# Patient Record
Sex: Female | Born: 1960 | Hispanic: No | Smoking: Current every day smoker
Health system: Southern US, Community
[De-identification: ages and names within clinical notes are randomized; demographics above are authoritative.]

## PROBLEM LIST (undated history)

## (undated) DIAGNOSIS — F4310 Post-traumatic stress disorder, unspecified: Secondary | ICD-10-CM

## (undated) DIAGNOSIS — M1990 Unspecified osteoarthritis, unspecified site: Secondary | ICD-10-CM

## (undated) DIAGNOSIS — F419 Anxiety disorder, unspecified: Secondary | ICD-10-CM

## (undated) DIAGNOSIS — F1990 Other psychoactive substance use, unspecified, uncomplicated: Secondary | ICD-10-CM

## (undated) DIAGNOSIS — F1020 Alcohol dependence, uncomplicated: Secondary | ICD-10-CM

## (undated) DIAGNOSIS — E785 Hyperlipidemia, unspecified: Secondary | ICD-10-CM

## (undated) DIAGNOSIS — F458 Other somatoform disorders: Secondary | ICD-10-CM

## (undated) DIAGNOSIS — K219 Gastro-esophageal reflux disease without esophagitis: Secondary | ICD-10-CM

## (undated) DIAGNOSIS — I10 Essential (primary) hypertension: Secondary | ICD-10-CM

## (undated) DIAGNOSIS — J449 Chronic obstructive pulmonary disease, unspecified: Secondary | ICD-10-CM

## (undated) DIAGNOSIS — F32A Depression, unspecified: Secondary | ICD-10-CM

## (undated) HISTORY — PX: WRIST RECONSTRUCTION: SHX2675

## (undated) HISTORY — DX: Alcohol dependence, uncomplicated: F10.20

## (undated) HISTORY — PX: TUBAL LIGATION: SHX77

## (undated) HISTORY — DX: Post-traumatic stress disorder, unspecified: F43.10

## (undated) HISTORY — DX: Gastro-esophageal reflux disease without esophagitis: K21.9

## (undated) HISTORY — DX: Anxiety disorder, unspecified: F41.9

## (undated) HISTORY — DX: Essential (primary) hypertension: I10

## (undated) HISTORY — DX: Depression, unspecified: F32.A

## (undated) HISTORY — DX: Hyperlipidemia, unspecified: E78.5

## (undated) HISTORY — DX: Unspecified osteoarthritis, unspecified site: M19.90

## (undated) HISTORY — DX: Other psychoactive substance use, unspecified, uncomplicated: F19.90

## (undated) HISTORY — DX: Chronic obstructive pulmonary disease, unspecified: J44.9

---

## 1998-10-17 ENCOUNTER — Emergency Department (HOSPITAL_COMMUNITY): Admission: EM | Admit: 1998-10-17 | Discharge: 1998-10-17 | Payer: Self-pay | Admitting: Emergency Medicine

## 1999-09-01 ENCOUNTER — Emergency Department (HOSPITAL_COMMUNITY): Admission: EM | Admit: 1999-09-01 | Discharge: 1999-09-01 | Payer: Self-pay | Admitting: Emergency Medicine

## 2000-08-27 ENCOUNTER — Emergency Department (HOSPITAL_COMMUNITY): Admission: EM | Admit: 2000-08-27 | Discharge: 2000-08-28 | Payer: Self-pay | Admitting: Emergency Medicine

## 2000-08-28 ENCOUNTER — Encounter: Payer: Self-pay | Admitting: Emergency Medicine

## 2000-09-20 ENCOUNTER — Emergency Department (HOSPITAL_COMMUNITY): Admission: EM | Admit: 2000-09-20 | Discharge: 2000-09-20 | Payer: Self-pay | Admitting: Emergency Medicine

## 2000-11-04 ENCOUNTER — Encounter: Payer: Self-pay | Admitting: Emergency Medicine

## 2000-11-04 ENCOUNTER — Emergency Department (HOSPITAL_COMMUNITY): Admission: EM | Admit: 2000-11-04 | Discharge: 2000-11-04 | Payer: Self-pay | Admitting: Emergency Medicine

## 2002-01-10 ENCOUNTER — Emergency Department (HOSPITAL_COMMUNITY): Admission: EM | Admit: 2002-01-10 | Discharge: 2002-01-10 | Payer: Self-pay | Admitting: Emergency Medicine

## 2002-06-01 ENCOUNTER — Emergency Department (HOSPITAL_COMMUNITY): Admission: EM | Admit: 2002-06-01 | Discharge: 2002-06-01 | Payer: Self-pay | Admitting: Emergency Medicine

## 2004-12-03 ENCOUNTER — Emergency Department (HOSPITAL_COMMUNITY): Admission: EM | Admit: 2004-12-03 | Discharge: 2004-12-03 | Payer: Self-pay | Admitting: Emergency Medicine

## 2006-01-30 ENCOUNTER — Emergency Department (HOSPITAL_COMMUNITY): Admission: EM | Admit: 2006-01-30 | Discharge: 2006-01-30 | Payer: Self-pay | Admitting: Family Medicine

## 2006-03-27 ENCOUNTER — Emergency Department (HOSPITAL_COMMUNITY): Admission: EM | Admit: 2006-03-27 | Discharge: 2006-03-27 | Payer: Self-pay | Admitting: Family Medicine

## 2006-09-24 ENCOUNTER — Emergency Department (HOSPITAL_COMMUNITY): Admission: EM | Admit: 2006-09-24 | Discharge: 2006-09-24 | Payer: Self-pay | Admitting: Emergency Medicine

## 2006-09-25 ENCOUNTER — Emergency Department (HOSPITAL_COMMUNITY): Admission: EM | Admit: 2006-09-25 | Discharge: 2006-09-25 | Payer: Self-pay | Admitting: Emergency Medicine

## 2006-09-27 ENCOUNTER — Ambulatory Visit (HOSPITAL_COMMUNITY): Admission: RE | Admit: 2006-09-27 | Discharge: 2006-09-28 | Payer: Self-pay | Admitting: Orthopedic Surgery

## 2006-10-23 ENCOUNTER — Encounter: Admission: RE | Admit: 2006-10-23 | Discharge: 2006-12-24 | Payer: Self-pay | Admitting: Orthopedic Surgery

## 2007-04-10 ENCOUNTER — Emergency Department (HOSPITAL_COMMUNITY): Admission: EM | Admit: 2007-04-10 | Discharge: 2007-04-10 | Payer: Self-pay | Admitting: Emergency Medicine

## 2009-02-15 ENCOUNTER — Emergency Department (HOSPITAL_COMMUNITY): Admission: EM | Admit: 2009-02-15 | Discharge: 2009-02-15 | Payer: Self-pay | Admitting: Family Medicine

## 2009-04-06 ENCOUNTER — Emergency Department (HOSPITAL_COMMUNITY): Admission: EM | Admit: 2009-04-06 | Discharge: 2009-04-06 | Payer: Self-pay | Admitting: Emergency Medicine

## 2009-04-26 ENCOUNTER — Encounter: Admission: RE | Admit: 2009-04-26 | Discharge: 2009-06-08 | Payer: Self-pay | Admitting: Orthopedic Surgery

## 2010-07-07 ENCOUNTER — Emergency Department (HOSPITAL_COMMUNITY): Admission: EM | Admit: 2010-07-07 | Discharge: 2010-07-07 | Payer: Self-pay | Admitting: Family Medicine

## 2010-09-13 ENCOUNTER — Emergency Department (HOSPITAL_COMMUNITY)
Admission: EM | Admit: 2010-09-13 | Discharge: 2010-09-13 | Payer: Self-pay | Source: Home / Self Care | Admitting: Family Medicine

## 2011-01-08 ENCOUNTER — Emergency Department (HOSPITAL_COMMUNITY)
Admission: EM | Admit: 2011-01-08 | Discharge: 2011-01-08 | Discharge status: Against Medical Advice | Payer: Self-pay | Attending: Emergency Medicine | Admitting: Emergency Medicine

## 2011-01-08 DIAGNOSIS — X58XXXA Exposure to other specified factors, initial encounter: Secondary | ICD-10-CM | POA: Insufficient documentation

## 2011-01-08 DIAGNOSIS — S61209A Unspecified open wound of unspecified finger without damage to nail, initial encounter: Secondary | ICD-10-CM | POA: Insufficient documentation

## 2011-01-25 NOTE — Op Note (Signed)
NAME:  Lori Houston, CONSUEGRA            ACCOUNT NO.:  [id number]   MEDICAL RECORD NO.:  [id number]          PATIENT TYPE:  OIB   LOCATION:  2550                         FACILITY:  [facility]   PHYSICIAN:  Dionne Ano. Gramig III, M.D.DATE OF BIRTH:  [birth year]   DATE OF PROCEDURE:  09/27/2006  DATE OF DISCHARGE:                               OPERATIVE REPORT   PREOPERATIVE DIAGNOSIS:  Right comminuted complex interarticular distal  radius fracture.   POSTOPERATIVE DIAGNOSIS:  Right comminuted complex interarticular distal  radius fracture.   PROCEDURE:  1. Right distal radius open reduction internal fixation with DVR plate      and screw fixation.  2. Stress radiography.   SURGEON:  Dominica Severin.   ASSISTANT:  Karie Chimera.   COMPLICATIONS:  None.   ANESTHESIA:  General.   TOURNIQUET TIME:  Less than an hour.   DRAINS:  One.   INDICATIONS FOR PROCEDURE:  The patient is a 50 year old female who  presents with a distal radius fracture.  She was reduced 2-1/2 days ago  and has undergone progressive angulatory collapse.  I have discussed  with her the implications of such and the need for ORIF to preserve  radial height, volar tilt and inclination.  I have discussed with the  patient the risks and benefits of surgery including risk of bleeding,  infection, anesthesia, damage to normal structures and failure of  surgery to accomplish its intended goals of relieving symptoms and  restoring function.  With this in mind, she desires to proceed.  All  questions have been encouraged and answered.   OPERATIVE PROCEDURE:  The patient was seen by myself and anesthesia.  She presented to the operative suite and underwent a smooth induction of  a general anesthesia.  Arm was marked, permit was signed, and she was  given preoperative antibiotics.  Following this, she was prepped and  draped in usual sterile fashion after general anesthesia was secured.  Once a sterile field was secured and  she was nicely padded, I inflated  the tourniquet to 250 mmHg.  I made a volar radial approach to the  wrist.  Carpal canal contents were retracted ulnarly.  Pronator  quadratus was incised in L-shaped fashion although it was highly damaged  and irregular.  The patient had radial to ulnar elevation of the  pronator to access the fracture.  Following this, the fracture was  reduced to my satisfaction without difficulty.  Once it was reduced, I  then applied a DVR plate from [employer] in the standard AO  technique.  The patient tolerated this well.  This restored her radial  height, radial tilt/volar tilt and radial inclination.  The patient  tolerated the application quite well.  Once this was done, the patient  then underwent a smooth irrigation process followed by stress  radiography revealing excellent position of the plate and screw  construct.  She had excellent stability under live fluoro.  No tendency  towards collapse or other problems.  I was pleased with the findings.  At this time, we then deflated the tourniquet, repaired the pronator  quadratus as best  as it would allow [place] with 3-0 Vicryl, and following  this, placed a PLS drain.  Vicryl was used for the subcu, and skin edge  was repaired with Prolene.  The patient had proximally 15-20 mL of 0.25%  Marcaine without epinephrine placed in the incision site for  postoperative analgesia.  She was dressed with Xeroform followed by  gauze and a volar plaster splint.  She tolerated this well.   Her distal radioulnar joint was stable throughout the course of the  operative procedure, and we were quite pleased with this.   Once this was complete, the patient then underwent transfer to the  recovery room after extubation.  In the recovery room, she was stable.  She will be admitted for IV antibiotics, general observation and  postoperative management.  We have discussed with her the do's and  dont's and will continue close  observation in the overnight.  All  questions have been encouraged and answered.  At the time of first  postoperative visit, I will need her splint removed and x-rays taken, of  course, followed by cast application.           ______________________________  Dionne Ano. Everlene Other, M.D.     Nash Mantis  D:  09/27/2006  T:  09/27/2006  Job:  [id number]

## 2011-06-24 LAB — RAPID STREP SCREEN (MED CTR MEBANE ONLY): Streptococcus, Group A Screen (Direct): NEGATIVE

## 2011-07-23 ENCOUNTER — Encounter: Payer: Self-pay | Admitting: *Deleted

## 2011-07-23 ENCOUNTER — Emergency Department (HOSPITAL_COMMUNITY)
Admission: EM | Admit: 2011-07-23 | Discharge: 2011-07-23 | Discharge status: Against Medical Advice | Payer: Self-pay | Attending: Emergency Medicine | Admitting: Emergency Medicine

## 2011-07-23 DIAGNOSIS — K089 Disorder of teeth and supporting structures, unspecified: Secondary | ICD-10-CM | POA: Insufficient documentation

## 2011-07-23 DIAGNOSIS — R109 Unspecified abdominal pain: Secondary | ICD-10-CM | POA: Insufficient documentation

## 2011-07-23 LAB — URINALYSIS, ROUTINE W REFLEX MICROSCOPIC
Bilirubin Urine: NEGATIVE
Hgb urine dipstick: NEGATIVE
Nitrite: NEGATIVE
Protein, ur: NEGATIVE mg/dL
Specific Gravity, Urine: 1.019 (ref 1.005–1.030)
Urobilinogen, UA: 1 mg/dL (ref 0.0–1.0)

## 2011-07-23 LAB — RAPID URINE DRUG SCREEN, HOSP PERFORMED
Barbiturates: NOT DETECTED
Cocaine: POSITIVE — AB
Opiates: NOT DETECTED
Tetrahydrocannabinol: POSITIVE — AB

## 2011-07-23 NOTE — ED Notes (Signed)
To ed for eval of toothache and flank pain for the past 2 days.

## 2011-07-25 ENCOUNTER — Emergency Department (HOSPITAL_COMMUNITY)
Admission: EM | Admit: 2011-07-25 | Discharge: 2011-07-26 | Disposition: A | Discharge status: Home / Self Care | Payer: Self-pay | Attending: Emergency Medicine | Admitting: Emergency Medicine

## 2011-07-25 ENCOUNTER — Encounter (HOSPITAL_COMMUNITY): Payer: Self-pay | Admitting: Emergency Medicine

## 2011-07-25 DIAGNOSIS — K089 Disorder of teeth and supporting structures, unspecified: Secondary | ICD-10-CM | POA: Insufficient documentation

## 2011-07-25 DIAGNOSIS — F17200 Nicotine dependence, unspecified, uncomplicated: Secondary | ICD-10-CM | POA: Insufficient documentation

## 2011-07-25 DIAGNOSIS — K029 Dental caries, unspecified: Secondary | ICD-10-CM | POA: Insufficient documentation

## 2011-07-25 DIAGNOSIS — K0889 Other specified disorders of teeth and supporting structures: Secondary | ICD-10-CM

## 2011-07-25 DIAGNOSIS — M5430 Sciatica, unspecified side: Secondary | ICD-10-CM | POA: Insufficient documentation

## 2011-07-25 HISTORY — DX: Other somatoform disorders: F45.8

## 2011-07-25 LAB — URINALYSIS, ROUTINE W REFLEX MICROSCOPIC
Bilirubin Urine: NEGATIVE
Glucose, UA: NEGATIVE mg/dL
Hgb urine dipstick: NEGATIVE
Ketones, ur: NEGATIVE mg/dL
Leukocytes, UA: NEGATIVE
Nitrite: NEGATIVE
Protein, ur: NEGATIVE mg/dL
Specific Gravity, Urine: 1.008 (ref 1.005–1.030)
Urobilinogen, UA: 0.2 mg/dL (ref 0.0–1.0)
pH: 7 (ref 5.0–8.0)

## 2011-07-25 NOTE — ED Provider Notes (Signed)
History     CSN: [id number] Arrival date & time: 07/25/2011 10:12 PM   First MD Initiated Contact with Patient 07/25/11 2348      Chief Complaint  Patient presents with  . Flank Pain  . Headache  . Dental Pain  . Oral Swelling    (Consider location/radiation/quality/duration/timing/severity/associated sxs/prior treatment) HPI This is a 50 year old black female with about a five-day history of bilateral lower back pain. The pain is located in the upper buttocks and radiates down the back of the legs. It is primarily located on the right side but sometimes shifts to the left. It is moderate to severe and worse with movement of the legs at the hips or when sitting. There is no associated weakness or numbness. There is no associated bowel or bladder difficulty. There was no trauma that triggered it. Thinking the pain was from her kidney she has been drinking excessive amounts of water and cranberry juice without relief.  She also complains of toothache in her right upper lateral incisor. This tooth has been chipped for a long period of time. It began hurting about 3 weeks ago and is worsening. It is causing her to have a headache. It is worse with eating  History reviewed. No pertinent past medical history.  Past Surgical History  Procedure Date  . Wrist reconstruction     History reviewed. No pertinent family history.  History  Substance Use Topics  . Smoking status: Current Everyday Smoker -- 1.0 packs/day    Types: Cigarettes  . Smokeless tobacco: Not on file  . Alcohol Use: 1.2 oz/week    2 Cans of beer per week    OB History    Grav Para Term Preterm Abortions TAB SAB Ect Mult Living   3    1           Review of Systems  All other systems reviewed and are negative.    Allergies  Review of patient's allergies indicates no known allergies.  Home Medications  No current outpatient prescriptions on file.  BP 141/93  Pulse 93  Temp(Src) 98.3 F (36.8 C) (Oral)   Resp 18  SpO2 99%  LMP 07/10/2011  Physical Exam General: Well-developed, well-nourished female in no acute distress; appearance consistent with age of record HENT: normocephalic, atraumatic; non-molar teeth worn down to dentin; carious right upper lateral incisor, tender to percussion Eyes: pupils equal round and reactive to light; extraocular muscles intact Neck: supple Heart: regular rate and rhythm Lungs: Normal respiratory effort and excursion Abdomen: soft; nondistended Back: No L-spine tenderness or paraspinal tenderness Extremities: No deformity; full range of motion; no buttock tenderness; pain on movement of hips R>L Neurologic: Awake, alert; motor function intact in all extremities and symmetric; no facial droop Skin: Warm and dry Psychiatric: Normal mood and affect    ED Course  Procedures (including critical care time)    MDM   Nursing notes and vitals signs, including pulse oximetry, reviewed.  Summary of this visit's results, reviewed by myself:  Labs:  Results for orders placed during the hospital encounter of 07/25/11  URINALYSIS, ROUTINE W REFLEX MICROSCOPIC      Component Value Range   Color, Urine YELLOW  YELLOW    Appearance CLEAR  CLEAR    Specific Gravity, Urine 1.008  1.005 - 1.030    pH 7.0  5.0 - 8.0    Glucose, UA NEGATIVE  NEGATIVE (mg/dL)   Hgb urine dipstick NEGATIVE  NEGATIVE    Bilirubin Urine NEGATIVE  NEGATIVE    Ketones, ur NEGATIVE  NEGATIVE (mg/dL)   Protein, ur NEGATIVE  NEGATIVE (mg/dL)   Urobilinogen, UA 0.2  0.0 - 1.0 (mg/dL)   Nitrite NEGATIVE  NEGATIVE    Leukocytes, UA NEGATIVE  NEGATIVE            Hanley Seamen, MD 07/26/11 0010

## 2011-07-25 NOTE — ED Notes (Signed)
Pt to ED with bilateral flank pain x 5 days that has gotten progressively worse. Pt with urinary incontinence. Pt denies any dysuria or any other symptoms. Pt also with c/o toothache to upper right side x a few days. Pt in gown. Pt resting on stretcher. Bed locked low position, SR upx 2. Call bell in reach.   IV Placed. 20ga placed to RAC x 1 attempt.  Full rainbow sent to lab. Site prepped. Pt tolerated well. IV secured.

## 2011-07-25 NOTE — ED Notes (Signed)
Pt reports that s/s of left flank pain 9/10 with decrease urination started about week now, pt states she increased fluid intake to help with urination, also repots having toothache secondary to the chipped upper tooth, to the right side. Pt also states that she developed HA after tooth pain started 9/10

## 2011-07-26 ENCOUNTER — Encounter (HOSPITAL_COMMUNITY): Payer: Self-pay | Admitting: Emergency Medicine

## 2011-07-26 MED ORDER — PENICILLIN V POTASSIUM 500 MG PO TABS: 500.0000 mg | ORAL_TABLET | Freq: Three times a day (TID) | ORAL | Status: AC

## 2011-07-26 MED ORDER — HYDROMORPHONE HCL PF 2 MG/ML IJ SOLN
2.0000 mg | Freq: Once | INTRAMUSCULAR | Status: AC
  Administered 2011-07-26: 2 mg via INTRAMUSCULAR
  Filled 2011-07-26: qty 1

## 2011-07-26 MED ORDER — ONDANSETRON 8 MG PO TBDP
8.0000 mg | ORAL_TABLET | Freq: Once | ORAL | Status: AC
  Administered 2011-07-26: 8 mg via ORAL
  Filled 2011-07-26: qty 1

## 2011-07-26 MED ORDER — HYDROCODONE-ACETAMINOPHEN 10-500 MG PO TABS: 1.0000 | ORAL_TABLET | Freq: Four times a day (QID) | ORAL | Status: AC | PRN

## 2011-07-26 MED ORDER — PENICILLIN V POTASSIUM 500 MG PO TABS
500.0000 mg | ORAL_TABLET | Freq: Once | ORAL | Status: AC
  Administered 2011-07-26: 500 mg via ORAL
  Filled 2011-07-26: qty 1

## 2011-07-26 NOTE — ED Notes (Signed)
Pt given discharge instructions and verbalizes understanding  

## 2012-05-19 ENCOUNTER — Encounter (HOSPITAL_COMMUNITY): Payer: Self-pay

## 2012-05-19 ENCOUNTER — Emergency Department (HOSPITAL_COMMUNITY)
Admission: EM | Admit: 2012-05-19 | Discharge: 2012-05-20 | Disposition: A | Discharge status: Home / Self Care | Payer: Self-pay | Attending: Emergency Medicine | Admitting: Emergency Medicine

## 2012-05-19 DIAGNOSIS — J3489 Other specified disorders of nose and nasal sinuses: Secondary | ICD-10-CM | POA: Insufficient documentation

## 2012-05-19 DIAGNOSIS — F17200 Nicotine dependence, unspecified, uncomplicated: Secondary | ICD-10-CM | POA: Insufficient documentation

## 2012-05-19 DIAGNOSIS — K089 Disorder of teeth and supporting structures, unspecified: Secondary | ICD-10-CM | POA: Insufficient documentation

## 2012-05-19 DIAGNOSIS — R0981 Nasal congestion: Secondary | ICD-10-CM

## 2012-05-19 MED ORDER — HYDROCOD POLST-CHLORPHEN POLST 10-8 MG/5ML PO LQCR: 5.0000 mL | Freq: Two times a day (BID) | ORAL | Status: DC

## 2012-05-19 MED ORDER — HYDROCOD POLST-CHLORPHEN POLST 10-8 MG/5ML PO LQCR
5.0000 mL | Freq: Once | ORAL | Status: AC
  Administered 2012-05-19: 5 mL via ORAL
  Filled 2012-05-19: qty 5

## 2012-05-19 MED ORDER — OXYMETAZOLINE HCL 0.05 % NA SOLN: 2.0000 | Freq: Two times a day (BID) | NASAL | Status: AC

## 2012-05-19 MED ORDER — CETIRIZINE-PSEUDOEPHEDRINE ER 5-120 MG PO TB12: 1.0000 | ORAL_TABLET | Freq: Every day | ORAL | Status: DC

## 2012-05-19 MED ORDER — GUAIFENESIN ER 600 MG PO TB12: 1200.0000 mg | ORAL_TABLET | Freq: Two times a day (BID) | ORAL | Status: DC

## 2012-05-19 NOTE — ED Provider Notes (Signed)
History     CSN: [id number]  Arrival date & time 05/19/12  2037   First MD Initiated Contact with Patient 05/19/12 2243      Chief Complaint  Patient presents with  . Nasal Congestion  . Dental Pain   HPI  History provided by the patient. Patient is a 51 year old female with no significant PMH who presents with complaints of nasal congestion, sinus pressure, headache and dental pains. Patient states that she feels right side of her cheek and face she relates from her nasal congestion. She does mention some problems with her dentition and states this could be part of the problems but denies any significant dental pains. She reports having off-and-on symptoms of congestion and frequent runny nose also her long. She denies prior history of similar or history of seasonal allergies. Patient has not taken any medications for her symptoms. Symptoms have been associated with occasional coughs with occasional sputum. She denies any hemoptysis. Denies shortness of breath or chest pain. Patient also mentions using large amounts of Pepto-Bismol several days ago which resulted in 2 days of diarrhea symptoms. She denies any nausea vomiting symptoms. She denies any fever, chills sweats.    Past Medical History  Diagnosis Date  . Bruxism     Past Surgical History  Procedure Date  . Wrist reconstruction     No family history on file.  History  Substance Use Topics  . Smoking status: Current Everyday Smoker -- 1.0 packs/day    Types: Cigarettes  . Smokeless tobacco: Not on file  . Alcohol Use: 1.2 oz/week    2 Cans of beer per week    OB History    Grav Para Term Preterm Abortions TAB SAB Ect Mult Living   3    1           Review of Systems  Constitutional: Positive for fatigue. Negative for fever, chills and appetite change.  HENT: Positive for congestion, rhinorrhea, dental problem and sinus pressure. Negative for sore throat.   Respiratory: Positive for cough. Negative for shortness  of breath.   Cardiovascular: Negative for chest pain.  Gastrointestinal: Positive for diarrhea. Negative for nausea, vomiting, abdominal pain and constipation.  Genitourinary: Negative for dysuria, frequency, hematuria and flank pain.  Skin: Negative for rash.    Allergies  Review of patient's allergies indicates no known allergies.  Home Medications   Current Outpatient Rx  Name Route Sig Dispense Refill  . IBUPROFEN 200 MG PO TABS Oral Take 400 mg by mouth every 6 (six) hours as needed. pain      BP 108/80  Pulse 98  Temp 99 F (37.2 C) (Oral)  Resp 18  Wt 135 lb (61.236 kg)  SpO2 97%  LMP 05/19/2012  Physical Exam  Nursing note and vitals reviewed. Constitutional: She is oriented to person, place, and time. She appears well-developed and well-nourished. No distress.  HENT:  Head: Normocephalic.       Patient with several extracted teeth. There is evidence of erosion into the frontal upper and lower incisors there is a loose lower left lateral incisor. No swelling of the gums. No tenderness to percussion.  Right nasal mucosa edematous with slight drainage. There is tenderness over right maxillary sinus area.   Eyes: Conjunctivae and EOM are normal. Pupils are equal, round, and reactive to light.  Neck: Normal range of motion. Neck supple.       No meningeal sign  Cardiovascular: Normal rate and regular rhythm.   Pulmonary/Chest:  Effort normal and breath sounds normal. No respiratory distress. She has no wheezes. She has no rales.  Abdominal: Soft. Bowel sounds are normal. There is no tenderness. There is no guarding.  Lymphadenopathy:    She has no cervical adenopathy.  Neurological: She is alert and oriented to person, place, and time.  Skin: Skin is warm and dry. No rash noted.  Psychiatric: She has a normal mood and affect. Her behavior is normal.    ED Course  Procedures     1. Nasal congestion       MDM  10:45 PM patient seen and evaluated. Patient is  not appear to be ill or toxic. Symptoms are chronic in nature.        Angus Seller, [place] 05/20/12 [phone]

## 2012-05-19 NOTE — ED Notes (Signed)
Pt c/o facial/tooth pain on rt side, sinus and chest congestion x61months,

## 2012-05-23 NOTE — ED Provider Notes (Signed)
  Medications  ibuprofen (ADVIL,MOTRIN) 200 MG tablet (not administered)  cetirizine-pseudoephedrine (ZYRTEC-D) 5-120 MG per tablet (not administered)  chlorpheniramine-HYDROcodone (TUSSIONEX PENNKINETIC ER) 10-8 MG/5ML LQCR (not administered)  guaiFENesin (MUCINEX) 600 MG 12 hr tablet (not administered)  oxymetazoline (AFRIN NASAL SPRAY) 0.05 % nasal spray (not administered)  chlorpheniramine-HYDROcodone (TUSSIONEX) 10-8 MG/5ML suspension 5 mL (5 mL Oral Given 05/19/12 2345)   Medical screening examination/treatment/procedure(s) were performed by non-physician practitioner and as supervising physician I was immediately available for consultation/collaboration.  Jones Skene, M.D.     Jones Skene, MD 05/23/12 1701

## 2012-06-11 ENCOUNTER — Emergency Department (HOSPITAL_COMMUNITY)
Admission: EM | Admit: 2012-06-11 | Discharge: 2012-06-12 | Disposition: A | Discharge status: Home / Self Care | Payer: Self-pay | Attending: Emergency Medicine | Admitting: Emergency Medicine

## 2012-06-11 ENCOUNTER — Encounter (HOSPITAL_COMMUNITY): Payer: Self-pay

## 2012-06-11 ENCOUNTER — Emergency Department (HOSPITAL_COMMUNITY): Payer: Self-pay

## 2012-06-11 DIAGNOSIS — M25539 Pain in unspecified wrist: Secondary | ICD-10-CM | POA: Insufficient documentation

## 2012-06-11 DIAGNOSIS — W010XXA Fall on same level from slipping, tripping and stumbling without subsequent striking against object, initial encounter: Secondary | ICD-10-CM | POA: Insufficient documentation

## 2012-06-11 DIAGNOSIS — M25519 Pain in unspecified shoulder: Secondary | ICD-10-CM | POA: Insufficient documentation

## 2012-06-11 DIAGNOSIS — Y9229 Other specified public building as the place of occurrence of the external cause: Secondary | ICD-10-CM | POA: Insufficient documentation

## 2012-06-11 DIAGNOSIS — M542 Cervicalgia: Secondary | ICD-10-CM | POA: Insufficient documentation

## 2012-06-11 DIAGNOSIS — W19XXXA Unspecified fall, initial encounter: Secondary | ICD-10-CM

## 2012-06-11 NOTE — ED Notes (Signed)
Patient c/o left shoulder pain and bilateral pain in knees and wrist , rates pain 10/10

## 2012-06-11 NOTE — ED Notes (Signed)
Pt fell today and landed wrist, knees and left shoulder.  Shoulder and Neck pain is the most painful.  Pt has plates in bil wrist.  No LOC. No head injury.  Pt slipped on water.

## 2012-06-12 ENCOUNTER — Emergency Department (HOSPITAL_COMMUNITY): Payer: Self-pay

## 2012-06-12 MED ORDER — ONDANSETRON 4 MG PO TBDP
4.0000 mg | ORAL_TABLET | Freq: Once | ORAL | Status: AC
  Administered 2012-06-12: 4 mg via ORAL
  Filled 2012-06-12: qty 1

## 2012-06-12 MED ORDER — MORPHINE SULFATE 4 MG/ML IJ SOLN
4.0000 mg | Freq: Once | INTRAMUSCULAR | Status: AC
  Administered 2012-06-12: 4 mg via INTRAMUSCULAR
  Filled 2012-06-12: qty 1

## 2012-06-12 MED ORDER — CYCLOBENZAPRINE HCL 10 MG PO TABS: 10.0000 mg | ORAL_TABLET | Freq: Two times a day (BID) | ORAL | Status: DC | PRN

## 2012-06-12 MED ORDER — HYDROCODONE-ACETAMINOPHEN 5-500 MG PO TABS: 1.0000 | ORAL_TABLET | Freq: Four times a day (QID) | ORAL | Status: DC | PRN

## 2012-06-12 NOTE — ED Provider Notes (Signed)
Medical screening examination/treatment/procedure(s) were performed by non-physician practitioner and as supervising physician I was immediately available for consultation/collaboration. Devoria Albe, MD, Armando Gang   Ward Givens, MD 06/12/12 [phone]

## 2012-06-12 NOTE — ED Provider Notes (Signed)
History     CSN: [id number]  Arrival date & time 06/11/12  2040   First MD Initiated Contact with Patient 06/11/12 2342      Chief Complaint  Patient presents with  . Shoulder Pain  . Neck Pain    (Consider location/radiation/quality/duration/timing/severity/associated sxs/prior treatment) HPI  Patient presents to the ED with complaints of a fall at the bookstore today. She slipped and fell in water, landing on her bilateral wrists, injuring them both and her left shoulder/neck. She denies LOC or head injury. Denies lumbar or thoracic back pain. Pt is crying and complaining that her shoulder and neck hurt so bad and that her wrists just feel "weird".   VSS  Past Medical History  Diagnosis Date  . Bruxism     Past Surgical History  Procedure Date  . Wrist reconstruction   . Tubal ligation     No family history on file.  History  Substance Use Topics  . Smoking status: Current Every Day Smoker -- 1.0 packs/day    Types: Cigarettes  . Smokeless tobacco: Not on file  . Alcohol Use: 1.2 oz/week    2 Cans of beer per week     daily    OB History    Grav Para Term Preterm Abortions TAB SAB Ect Mult Living   3    1           Review of Systems   Review of Systems  Gen: no weight loss, fevers, chills, night sweats  Eyes: no discharge or drainage, no occular pain or visual changes  Nose: no epistaxis or rhinorrhea  Mouth: no dental pain, no sore throat  Neck: + neck pain  Lungs:No wheezing, coughing or hemoptysis CV: no chest pain, palpitations, dependent edema or orthopnea  Abd: no abdominal pain, nausea, vomiting  GU: no dysuria or gross hematuria  MSK:  + left shoulder pain and bilateral wrist pain Neuro: no headache, no focal neurologic deficits  Skin: no abnormalities Psyche: negative.    Allergies  Review of patient's allergies indicates no known allergies.  Home Medications   Current Outpatient Rx  Name Route Sig Dispense Refill  .  CETIRIZINE-PSEUDOEPHEDRINE ER 5-120 MG PO TB12 Oral Take 1 tablet by mouth daily. 30 tablet 0  . GUAIFENESIN ER 600 MG PO TB12 Oral Take 2 tablets (1,200 mg total) by mouth 2 (two) times daily. 30 tablet 0  . IBUPROFEN 200 MG PO TABS Oral Take 400 mg by mouth every 6 (six) hours as needed. pain    . OVER THE COUNTER MEDICATION Nasal Place 1 spray into the nose 2 (two) times daily as needed. For nasal drainage. OTC. Allergy Nasal Spray    . HYDROCOD POLST-CPM POLST ER 10-8 MG/5ML PO LQCR Oral Take 5 mLs by mouth every 12 (twelve) hours. Take 5 mLs by mouth every 12 (twelve) hours. 140 mL 0  . CYCLOBENZAPRINE HCL 10 MG PO TABS Oral Take 1 tablet (10 mg total) by mouth 2 (two) times daily as needed for muscle spasms. 20 tablet 0  . HYDROCODONE-ACETAMINOPHEN 5-500 MG PO TABS Oral Take 1-2 tablets by mouth every 6 (six) hours as needed for pain. 15 tablet 0    BP 122/85  Pulse 92  Temp 98.2 F (36.8 C) (Oral)  Resp 20  SpO2 97%  LMP 05/19/2012  Physical Exam  Nursing note and vitals reviewed. Constitutional: She appears well-developed and well-nourished. No distress.  HENT:  Head: Normocephalic and atraumatic.  Eyes: Pupils are  equal, round, and reactive to light.  Neck: Trachea normal. Neck supple. Spinous process tenderness and muscular tenderness present. No rigidity. Decreased range of motion present. No edema and no erythema present.  Cardiovascular: Normal rate and regular rhythm.   Pulmonary/Chest: Effort normal.  Abdominal: Soft.  Musculoskeletal:       Left shoulder: She exhibits decreased range of motion, tenderness, bony tenderness, pain and spasm. She exhibits no swelling, no effusion, no crepitus, no deformity, no laceration, normal pulse and normal strength.       Right wrist: She exhibits tenderness. She exhibits normal range of motion, no bony tenderness, no swelling, no effusion, no crepitus, no deformity and no laceration.       Left wrist: She exhibits tenderness. She  exhibits normal range of motion, no bony tenderness, no swelling, no effusion, no crepitus, no deformity and no laceration.  Neurological: She is alert.  Skin: Skin is warm and dry.    ED Course  Procedures (including critical care time)  Labs Reviewed - No data to display Dg Wrist Complete Left  06/12/2012  *RADIOLOGY REPORT*  Clinical Data: Bilateral wrist pain  LEFT WRIST - COMPLETE 3+ VIEW  Comparison: 03/27/2006 forearm radiographs  Findings: Plate and screw fixation of the distal ulna.  Associated heterotopic ossification along the interosseous membrane. Small calcific density along the lateral base of the first metacarpal acute fracture.  No dislocation.  No aggressive osseous lesion.  IMPRESSION: Tiny calcific density along the base of the lateral aspect first metacarpal. Correlate with point tenderness to exclude a small avulsion or chip fracture.  Otherwise, no acute fracture or dislocation identified.  Postoperative changes of the distal ulna.   Original Report Authenticated By: Waneta Martins, M.D.    Dg Wrist Complete Right  06/12/2012  *RADIOLOGY REPORT*  Clinical Data: Wrist pain status post fall.  RIGHT WRIST - COMPLETE 3+ VIEW  Comparison: 09/24/2006  Findings: Status post plate and screw fixation of the distal radius.  Sequelae of prior ulnar styloid fracture.  No acute fracture or dislocation identified.  No aggressive osseous lesion.  IMPRESSION: Postoperative changes of the distal radius.  No acute fracture or dislocation identified. If clinical concern for a fracture persists, recommend a repeat radiograph in 5-10 days to evaluate for interval change or callus formation.   Original Report Authenticated By: Waneta Martins, M.D.    Ct Cervical Spine Wo Contrast  06/12/2012  *RADIOLOGY REPORT*  Clinical Data: Midline cervical pain.  Recent fall.  CT CERVICAL SPINE WITHOUT CONTRAST  Technique:  Multidetector CT imaging of the cervical spine was performed. Multiplanar CT  image reconstructions were also generated.  Comparison: None.  Findings: Biapical emphysematous changes.  Maintained craniocervical relationship.  No dens fracture.  Loss of normal cervical lordosis.  Grade 1 retrolisthesis of C5 on C6.  C4-5 disc bulge results in moderate central canal narrowing. C6-7 disc bulge results in left paracentral canal narrowing.  Mild bilateral neural foraminal narrowing at C5-6 and moderate right neural foraminal narrowing at C6-7.  Paravertebral soft tissues within normal limits.  IMPRESSION: Multilevel degenerative changes and disc bulges as above.  Loss of normal cervical lordosis may be secondary to positioning, muscle spasm, or ligamentous injury.  No static evidence of acute fracture or dislocation.   Original Report Authenticated By: Waneta Martins, M.D.    Dg Shoulder Left  06/12/2012  *RADIOLOGY REPORT*  Clinical Data: Anterior left shoulder pain.  LEFT SHOULDER - 2+ VIEW  Comparison: 02/15/2009 contralateral shoulder  Findings: Moderate glenohumeral degenerative change with humeral head osteophyte formation and mild acromioclavicular degenerative changes.  No acute fracture or dislocation identified.  Left upper lung is clear.  IMPRESSION: Degenerative changes without acute fracture or dislocation identified.   Original Report Authenticated By: Waneta Martins, M.D.      1. Fall       MDM  Patient withpain s/p fall. No neurological deficits. Patient is ambulatory. No warning symptoms of back pain including: loss of bowel or bladder control, night sweats, waking from sleep with back pain, unexplained fevers or weight loss, h/o cancer, IVDU. Marland Kitchen No concern for cauda equina, epidural abscess, or other serious cause of back pain. Conservative measures such as rest, ice/heat and pain medicine indicated with PCP follow-up if no improvement with conservative management.     pts pain managed with IM Morphine in ED.  xrays are none acute. Findings on right wrist  xray do not correlate with physical exam as she is not tender overlying this area.  Rx for flexeril and Vicodin given. Referral to Ortho in case pain persists.  Pt has been advised of the symptoms that warrant their return to the ED. Patient has voiced understanding and has agreed to follow-up with the PCP or specialist.         Dorthula Matas, PA 06/12/12 0105

## 2012-07-21 ENCOUNTER — Emergency Department (INDEPENDENT_AMBULATORY_CARE_PROVIDER_SITE_OTHER)
Admission: EM | Admit: 2012-07-21 | Discharge: 2012-07-21 | Disposition: A | Discharge status: Home / Self Care | Payer: Self-pay | Source: Home / Self Care | Attending: Emergency Medicine | Admitting: Emergency Medicine

## 2012-07-21 ENCOUNTER — Encounter (HOSPITAL_COMMUNITY): Payer: Self-pay | Admitting: Emergency Medicine

## 2012-07-21 DIAGNOSIS — G8929 Other chronic pain: Secondary | ICD-10-CM

## 2012-07-21 DIAGNOSIS — M545 Low back pain: Secondary | ICD-10-CM

## 2012-07-21 MED ORDER — MELOXICAM 7.5 MG PO TABS: 7.5000 mg | ORAL_TABLET | Freq: Every day | ORAL | Status: DC

## 2012-07-21 MED ORDER — KETOROLAC TROMETHAMINE 60 MG/2ML IM SOLN
INTRAMUSCULAR | Status: AC
  Filled 2012-07-21: qty 2

## 2012-07-21 MED ORDER — KETOROLAC TROMETHAMINE 60 MG/2ML IM SOLN: 60.0000 mg | Freq: Once | INTRAMUSCULAR | Status: DC

## 2012-07-21 NOTE — ED Notes (Signed)
Reports back pain since 10/3 when she fell on [employer] floor while going to class.  Patient sees a Administrator, [employer]. Patient states she wants something for pain.

## 2012-07-21 NOTE — ED Provider Notes (Signed)
History     CSN: [id number]  Arrival date & time 07/21/12  7829   First MD Initiated Contact with Patient 07/21/12 1833      Chief Complaint  Patient presents with  . Tailbone Pain    (Consider location/radiation/quality/duration/timing/severity/associated sxs/prior treatment) HPI Comments: Patient presents this evening complaining of ongoing back pain. She sustained a fall at [facility] at the beginning of October on the floor was she was going to class. She "SLIPPED". She went to the emergency department at that time she had x-rays and was told that she had no fractures. Subsequently to that she is being seen a local chiropractor where she has been receiving treatment which she describes that the chiropractor cannot prescribe anything for pain. She is sore all over her back and feels that the weather has exacerbated her pain tonight. PATIENT DENIES ANY numbness, tingling sensations whenever lower extremities at this point. Denies any saddle paresthesia, urinary changes such as incontinence, and no loss of bowel control. Denies any weakness or lower extremities.  Patient is a 51 y.o. female presenting with back pain. The history is provided by the patient.  Back Pain  This is a chronic problem. The problem occurs constantly. The problem has not changed since onset.The pain is associated with falling. The pain is present in the lumbar spine. The quality of the pain is described as aching. The pain is at a severity of 8/10. The pain is moderate. The symptoms are aggravated by bending, twisting and certain positions. Pertinent negatives include no fever, no numbness, no abdominal swelling, no bowel incontinence, no bladder incontinence, no pelvic pain, no paresthesias, no paresis, no tingling and no weakness. Treatments tried: A been receiving treatment at chiropractor office (Dr.Cobb)    Past Medical History  Diagnosis Date  . Bruxism     Past Surgical History  Procedure Date  . Wrist  reconstruction   . Tubal ligation     History reviewed. No pertinent family history.  History  Substance Use Topics  . Smoking status: Current Every Day Smoker -- 1.0 packs/day    Types: Cigarettes  . Smokeless tobacco: Not on file  . Alcohol Use: 1.2 oz/week    2 Cans of beer per week     Comment: daily    OB History    Grav Para Term Preterm Abortions TAB SAB Ect Mult Living   3    1           Review of Systems  Constitutional: Negative.  Negative for fever.  Gastrointestinal: Negative for bowel incontinence.  Genitourinary: Negative for bladder incontinence and pelvic pain.  Musculoskeletal: Positive for back pain.  Neurological: Negative for tingling, weakness, numbness and paresthesias.    Allergies  Review of patient's allergies indicates no known allergies.  Home Medications   Current Outpatient Rx  Name  Route  Sig  Dispense  Refill  . CETIRIZINE-PSEUDOEPHEDRINE ER 5-120 MG PO TB12   Oral   Take 1 tablet by mouth daily.   30 tablet   0   . HYDROCOD POLST-CPM POLST ER 10-8 MG/5ML PO LQCR   Oral   Take 5 mLs by mouth every 12 (twelve) hours. Take 5 mLs by mouth every 12 (twelve) hours.   140 mL   0   . CYCLOBENZAPRINE HCL 10 MG PO TABS   Oral   Take 1 tablet (10 mg total) by mouth 2 (two) times daily as needed for muscle spasms.   20 tablet   0   .  GUAIFENESIN ER 600 MG PO TB12   Oral   Take 2 tablets (1,200 mg total) by mouth 2 (two) times daily.   30 tablet   0   . HYDROCODONE-ACETAMINOPHEN 5-500 MG PO TABS   Oral   Take 1-2 tablets by mouth every 6 (six) hours as needed for pain.   15 tablet   0   . IBUPROFEN 200 MG PO TABS   Oral   Take 400 mg by mouth every 6 (six) hours as needed. pain         . OVER THE COUNTER MEDICATION   Nasal   Place 1 spray into the nose 2 (two) times daily as needed. For nasal drainage. OTC. Allergy Nasal Spray           BP 137/91  Pulse 93  Temp 98.7 F (37.1 C) (Oral)  Resp 16  SpO2  99%  Physical Exam  Nursing note and vitals reviewed. Constitutional: Vital signs are normal. She appears well-developed and well-nourished.  Non-toxic appearance. She does not have a sickly appearance. She does not appear ill. No distress.  Cardiovascular: Normal rate.   Musculoskeletal: Normal range of motion.       Back:  Neurological: She is alert.  Skin: No rash noted. No erythema.    ED Course  Procedures (including critical care time)  Labs Reviewed - No data to display No results found.   No diagnosis found.    MDM  Patient continues with back pain after having sustained a fall at the beginning of October. Reporting constant pain and exacerbations. Have recommended patient to take a course of meloxicam and a muscle relaxant to followup with orthopedic doctors this time. She is being attended bicarbonate fracture. Toradol 60 mg IM was provided tonight. Patient had no symptoms or signs of her exam of having any neural vascular deficits       Jimmie Molly, MD 07/21/12 [phone]

## 2012-09-11 ENCOUNTER — Encounter (HOSPITAL_COMMUNITY): Payer: Self-pay | Admitting: Emergency Medicine

## 2012-09-11 ENCOUNTER — Emergency Department (HOSPITAL_COMMUNITY): Payer: Self-pay

## 2012-09-11 ENCOUNTER — Emergency Department (HOSPITAL_COMMUNITY)
Admission: EM | Admit: 2012-09-11 | Discharge: 2012-09-11 | Disposition: A | Discharge status: Home / Self Care | Payer: Self-pay | Attending: Emergency Medicine | Admitting: Emergency Medicine

## 2012-09-11 DIAGNOSIS — Y939 Activity, unspecified: Secondary | ICD-10-CM | POA: Insufficient documentation

## 2012-09-11 DIAGNOSIS — Z8659 Personal history of other mental and behavioral disorders: Secondary | ICD-10-CM | POA: Insufficient documentation

## 2012-09-11 DIAGNOSIS — IMO0002 Reserved for concepts with insufficient information to code with codable children: Secondary | ICD-10-CM | POA: Insufficient documentation

## 2012-09-11 DIAGNOSIS — S6990XA Unspecified injury of unspecified wrist, hand and finger(s), initial encounter: Secondary | ICD-10-CM | POA: Insufficient documentation

## 2012-09-11 DIAGNOSIS — M79601 Pain in right arm: Secondary | ICD-10-CM

## 2012-09-11 DIAGNOSIS — Y929 Unspecified place or not applicable: Secondary | ICD-10-CM | POA: Insufficient documentation

## 2012-09-11 DIAGNOSIS — S46909A Unspecified injury of unspecified muscle, fascia and tendon at shoulder and upper arm level, unspecified arm, initial encounter: Secondary | ICD-10-CM | POA: Insufficient documentation

## 2012-09-11 DIAGNOSIS — F17200 Nicotine dependence, unspecified, uncomplicated: Secondary | ICD-10-CM | POA: Insufficient documentation

## 2012-09-11 DIAGNOSIS — W010XXA Fall on same level from slipping, tripping and stumbling without subsequent striking against object, initial encounter: Secondary | ICD-10-CM | POA: Insufficient documentation

## 2012-09-11 DIAGNOSIS — S4980XA Other specified injuries of shoulder and upper arm, unspecified arm, initial encounter: Secondary | ICD-10-CM | POA: Insufficient documentation

## 2012-09-11 DIAGNOSIS — W19XXXA Unspecified fall, initial encounter: Secondary | ICD-10-CM

## 2012-09-11 DIAGNOSIS — S59909A Unspecified injury of unspecified elbow, initial encounter: Secondary | ICD-10-CM | POA: Insufficient documentation

## 2012-09-11 MED ORDER — HYDROCODONE-ACETAMINOPHEN 5-325 MG PO TABS: 2.0000 | ORAL_TABLET | Freq: Four times a day (QID) | ORAL | Status: DC | PRN

## 2012-09-11 MED ORDER — OXYCODONE-ACETAMINOPHEN 5-325 MG PO TABS
2.0000 | ORAL_TABLET | Freq: Once | ORAL | Status: AC
  Administered 2012-09-11: 2 via ORAL
  Filled 2012-09-11: qty 2

## 2012-09-11 NOTE — ED Provider Notes (Signed)
History   This chart was scribed for non-physician practitioner working with Toy Baker, MD by Frederik Pear, ED Scribe. This patient was seen in room WTR8/WTR8 and the patient's care was started at 1705.   CSN: [id number]  Arrival date & time 09/11/12  1636   First MD Initiated Contact with Patient 09/11/12 1705      Chief Complaint  Patient presents with  . Wrist Pain    can move fingers, c/o wrist tenderness  . Arm Pain    unable to lift arm due to pain  . Shoulder Pain    c/o r/shoulder pain. fell on ice at 1300  . Elbow Pain    (Consider location/radiation/quality/duration/timing/severity/associated sxs/prior treatment) Patient is a 52 y.o. female presenting with extremity pain. The history is provided by the patient. No language interpreter was used.  Extremity Pain This is a new problem. The current episode started 3 to 5 hours ago. The problem occurs constantly. The problem has been gradually worsening. Pertinent negatives include no headaches. Exacerbated by: Movement. Nothing relieves the symptoms. She has tried nothing for the symptoms.  Extremity Pain This is a new problem. The current episode started 3 to 5 hours ago. The problem occurs constantly. The problem has been gradually worsening. Pertinent negatives include no headaches or neck pain. Exacerbated by: Movement. She has tried nothing for the symptoms.    BRITANIE HARSHMAN is a 52 y.o. female who presents to the Emergency Department complaining of gradually worsening, constant right arm pain from her wrist to her axillary area with associated lower back pain that began around 13:00 when she slipped and fell on some ice. She denies any neck pain, headaches, or LOC. She denies any treatment at home.   Past Medical History  Diagnosis Date  . Bruxism     Past Surgical History  Procedure Date  . Wrist reconstruction   . Tubal ligation     No family history on file.  History  Substance Use Topics  . Smoking  status: Current Every Day Smoker -- 1.0 packs/day    Types: Cigarettes  . Smokeless tobacco: Not on file  . Alcohol Use: 1.2 oz/week    2 Cans of beer per week     Comment: daily    OB History    Grav Para Term Preterm Abortions TAB SAB Ect Mult Living   3    1           Review of Systems  HENT: Negative for neck pain.   Musculoskeletal: Positive for back pain.       Arm pain  Skin: Negative for color change.  Neurological: Negative for syncope and headaches.  All other systems reviewed and are negative.    Allergies  Review of patient's allergies indicates no known allergies.  Home Medications   Current Outpatient Rx  Name  Route  Sig  Dispense  Refill  . ACETAMINOPHEN 325 MG PO TABS   Oral   Take 650 mg by mouth every 6 (six) hours as needed. Pain           BP 115/83  Pulse 83  Temp 98.4 F (36.9 C) (Oral)  Resp 18  SpO2 100%  Physical Exam  Nursing note and vitals reviewed. Constitutional: She appears well-developed and well-nourished.       She is tearful during exam.  HENT:  Head: Normocephalic and atraumatic.  Neck: Normal range of motion. Neck supple.  Cardiovascular: Normal rate, regular rhythm and normal  heart sounds.   No murmur heard.      She has good radial pulses.   Pulmonary/Chest: Effort normal and breath sounds normal. No respiratory distress.  Abdominal: There is no tenderness.  Musculoskeletal:       Right shoulder: She exhibits tenderness and bony tenderness. She exhibits no deformity.       Right elbow: She exhibits normal range of motion, no swelling and no deformity. no tenderness found.       Right wrist: She exhibits tenderness and bony tenderness. She exhibits no swelling and no deformity.       Cervical back: She exhibits normal range of motion, no tenderness, no bony tenderness, no swelling and no deformity.       Thoracic back: She exhibits normal range of motion, no tenderness, no bony tenderness, no swelling and no  deformity.       Lumbar back: She exhibits normal range of motion, no tenderness, no bony tenderness, no edema and no deformity.       Left upper arm: She exhibits bony tenderness. She exhibits no swelling, no edema and no deformity.       She has no tenderness of the humerus. She has tenderness over the wrist and shoulder. She has good cap refill.  Tenderness to palpation of the coccyx Tenderness to palpation of the dorsal aspect of the right arm Tenderness to palpation of the right forearm  Neurological: She is alert.       Her distal sensation is intact.   Skin: Skin is warm and dry.  Psychiatric: She has a normal mood and affect. Thought content normal.    ED Course  Procedures (including critical care time)  DIAGNOSTIC STUDIES: Oxygen Saturation is 100% on room air, normal by my interpretation.    COORDINATION OF CARE:  17: 30- Discussed planned course of treatment with the patient, who is agreeable at this time.  17:45- Medication Orders- oxycodone-acetaminophen (Percocet/Roxicet) 5-325 mg per tablet 2 tablet- Once.  Labs Reviewed - No data to display Dg Sacrum/coccyx  09/11/2012  *RADIOLOGY REPORT*  Clinical Data: Fall, pain over the coccyx  SACRUM AND COCCYX - 2+ VIEW  Comparison: None.  Findings: Sacroiliac joints are unremarkable.  No displaced sacral fracture.  Sacral alae are intact.  Normal coccygeal segmentation.  IMPRESSION: No acute pelvic or coccygeal fracture identified.   Original Report Authenticated By: Christiana Pellant, M.D.    Dg Shoulder Right  09/11/2012  *RADIOLOGY REPORT*  Clinical Data: Right arm pain after fall.  RIGHT SHOULDER - 2+ VIEW  Comparison: 02/15/2009  Findings: No fracture or dislocation is observed. The acromial undersurface is type 2 (curved).  IMPRESSION:  1.  No acute bony findings.   Original Report Authenticated By: Gaylyn Rong, M.D.    Dg Forearm Right  09/11/2012  *RADIOLOGY REPORT*  Clinical Data: Right arm pain after fall.  RIGHT  FOREARM - 2 VIEW  Comparison: None.  Findings: Remote ulnar styloid fracture noted with distal radial plate screw fixator.  No acute fracture or acute bony findings.  IMPRESSION:  1.  No acute bony findings.   Original Report Authenticated By: Gaylyn Rong, M.D.    Dg Wrist Complete Right  09/11/2012  *RADIOLOGY REPORT*  Clinical Data: Fall.  Upper extremity pain.  RIGHT WRIST - COMPLETE 3+ VIEW  Comparison: 06/12/2012  Findings: Plate screw fixator noted in the distal radius, with old well corticated ulnar styloid fracture unchanged from 06/12/2012.  The carpus appears unremarkable.  No definite scaphoid  fracture observed.  Minimal mid scaphoid ridging appears stable.  IMPRESSION:  1.  No acute findings. 2.  Distal radial plate screw fixator. 3.  Old ulnar styloid fracture.   Original Report Authenticated By: Gaylyn Rong, M.D.    Dg Hand Complete Right  09/11/2012  *RADIOLOGY REPORT*  Clinical Data: Fall.  Pain in the upper extremity.  RIGHT HAND - COMPLETE 3+ VIEW  Comparison: 06/12/2012  Findings: Distal radial plate and screw fixator.  Old corticated ulnar styloid fracture.  No new fracture of the wrist or hand identified.  IMPRESSION:  1.  Old radial fixator and old corticated ulnar styloid fracture. No acute bony findings.   Original Report Authenticated By: Gaylyn Rong, M.D.      No diagnosis found.    MDM  No acute findings on xrays.  Neurovascularly intact.  Patient given follow up with Orthopedics if pain persists.          Pascal [place], PA-C 09/12/12 [phone]

## 2012-09-11 NOTE — ED Notes (Signed)
Pt stated that she slipped and fell on ice at 1300. R/hand stopped her fall, pain in r/wrist, r/elbow, r/shoulder. No obvious deformities, decreased ROM noted. C/o pain i low back

## 2012-09-12 NOTE — ED Provider Notes (Signed)
Medical screening examination/treatment/procedure(s) were performed by non-physician practitioner and as supervising physician I was immediately available for consultation/collaboration.  Dustina Scoggin T Tamie Minteer, MD 09/12/12 1644 

## 2013-06-11 ENCOUNTER — Emergency Department (HOSPITAL_COMMUNITY): Payer: No Typology Code available for payment source

## 2013-06-11 ENCOUNTER — Emergency Department (HOSPITAL_COMMUNITY)
Admission: EM | Admit: 2013-06-11 | Discharge: 2013-06-12 | Disposition: A | Discharge status: Home / Self Care | Payer: No Typology Code available for payment source | Attending: Emergency Medicine | Admitting: Emergency Medicine

## 2013-06-11 DIAGNOSIS — R071 Chest pain on breathing: Secondary | ICD-10-CM | POA: Insufficient documentation

## 2013-06-11 DIAGNOSIS — F17200 Nicotine dependence, unspecified, uncomplicated: Secondary | ICD-10-CM | POA: Insufficient documentation

## 2013-06-11 DIAGNOSIS — Z8659 Personal history of other mental and behavioral disorders: Secondary | ICD-10-CM | POA: Insufficient documentation

## 2013-06-11 DIAGNOSIS — R079 Chest pain, unspecified: Secondary | ICD-10-CM

## 2013-06-11 DIAGNOSIS — R000 Tachycardia, unspecified: Secondary | ICD-10-CM | POA: Insufficient documentation

## 2013-06-11 MED ORDER — ONDANSETRON HCL 4 MG/2ML IJ SOLN: 4.0000 mg | Freq: Once | INTRAMUSCULAR | Status: DC

## 2013-06-11 MED ORDER — MORPHINE SULFATE 4 MG/ML IJ SOLN: 4.0000 mg | Freq: Once | INTRAMUSCULAR | Status: DC
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
## 2013-06-12 ENCOUNTER — Encounter (HOSPITAL_COMMUNITY): Payer: Self-pay | Admitting: Emergency Medicine

## 2013-06-12 LAB — POCT I-STAT, CHEM 8
BUN: 13 mg/dL (ref 6–23)
Calcium, Ion: 1.23 mmol/L (ref 1.12–1.23)
Chloride: 103 mEq/L (ref 96–112)
Glucose, Bld: 95 mg/dL (ref 70–99)
HCT: 46 % (ref 36.0–46.0)
Potassium: 3.8 mEq/L (ref 3.5–5.1)

## 2013-06-12 LAB — CBC
Hemoglobin: 14.7 g/dL (ref 12.0–15.0)
MCH: 33.3 pg (ref 26.0–34.0)
MCV: 93.7 fL (ref 78.0–100.0)
Platelets: 442 10*3/uL — ABNORMAL HIGH (ref 150–400)
RBC: 4.42 MIL/uL (ref 3.87–5.11)
WBC: 9.6 10*3/uL (ref 4.0–10.5)

## 2013-06-12 LAB — POCT I-STAT TROPONIN I: Troponin i, poc: 0 ng/mL (ref 0.00–0.08)

## 2013-06-12 MED ORDER — HYDROMORPHONE HCL PF 1 MG/ML IJ SOLN
1.0000 mg | Freq: Once | INTRAMUSCULAR | Status: AC
  Administered 2013-06-12: 1 mg via INTRAMUSCULAR
  Filled 2013-06-12: qty 1

## 2013-06-12 MED ORDER — KETOROLAC TROMETHAMINE 60 MG/2ML IM SOLN
60.0000 mg | Freq: Once | INTRAMUSCULAR | Status: AC
  Administered 2013-06-12: 60 mg via INTRAMUSCULAR
  Filled 2013-06-12: qty 2

## 2013-06-12 MED ORDER — IBUPROFEN 800 MG PO TABS: 800.0000 mg | ORAL_TABLET | Freq: Three times a day (TID) | ORAL | Status: DC

## 2013-06-12 MED ORDER — TRAMADOL HCL 50 MG PO TABS: 50.0000 mg | ORAL_TABLET | Freq: Four times a day (QID) | ORAL | Status: DC | PRN

## 2013-06-12 NOTE — ED Notes (Signed)
Pt arrived to ED with a complaint of chest pain.  Pt states that it feels like a pulled muscle.  Pt states that her pinkies are numb.  Pt states the pain started yesterday.  Pt states the pain radiates to her back

## 2013-06-17 ENCOUNTER — Emergency Department (HOSPITAL_COMMUNITY)
Admission: EM | Admit: 2013-06-17 | Discharge: 2013-06-17 | Disposition: A | Discharge status: Home / Self Care | Payer: No Typology Code available for payment source | Attending: Emergency Medicine | Admitting: Emergency Medicine

## 2013-06-17 ENCOUNTER — Emergency Department (HOSPITAL_COMMUNITY): Payer: No Typology Code available for payment source

## 2013-06-17 ENCOUNTER — Encounter (HOSPITAL_COMMUNITY): Payer: Self-pay | Admitting: Emergency Medicine

## 2013-06-17 DIAGNOSIS — R030 Elevated blood-pressure reading, without diagnosis of hypertension: Secondary | ICD-10-CM | POA: Insufficient documentation

## 2013-06-17 DIAGNOSIS — IMO0001 Reserved for inherently not codable concepts without codable children: Secondary | ICD-10-CM | POA: Insufficient documentation

## 2013-06-17 DIAGNOSIS — M7918 Myalgia, other site: Secondary | ICD-10-CM

## 2013-06-17 DIAGNOSIS — Z8659 Personal history of other mental and behavioral disorders: Secondary | ICD-10-CM | POA: Insufficient documentation

## 2013-06-17 DIAGNOSIS — F17200 Nicotine dependence, unspecified, uncomplicated: Secondary | ICD-10-CM | POA: Insufficient documentation

## 2013-06-17 DIAGNOSIS — R110 Nausea: Secondary | ICD-10-CM | POA: Insufficient documentation

## 2013-06-17 DIAGNOSIS — M546 Pain in thoracic spine: Secondary | ICD-10-CM | POA: Insufficient documentation

## 2013-06-17 DIAGNOSIS — K5900 Constipation, unspecified: Secondary | ICD-10-CM | POA: Insufficient documentation

## 2013-06-17 DIAGNOSIS — R209 Unspecified disturbances of skin sensation: Secondary | ICD-10-CM | POA: Insufficient documentation

## 2013-06-17 DIAGNOSIS — M542 Cervicalgia: Secondary | ICD-10-CM | POA: Insufficient documentation

## 2013-06-17 MED ORDER — KETOROLAC TROMETHAMINE 60 MG/2ML IM SOLN
60.0000 mg | Freq: Once | INTRAMUSCULAR | Status: AC
  Administered 2013-06-17: 60 mg via INTRAMUSCULAR
  Filled 2013-06-17: qty 2

## 2013-06-17 MED ORDER — DIAZEPAM 5 MG PO TABS
10.0000 mg | ORAL_TABLET | Freq: Once | ORAL | Status: AC
  Administered 2013-06-17: 10 mg via ORAL
  Filled 2013-06-17: qty 2

## 2013-06-17 MED ORDER — DIAZEPAM 5 MG PO TABS: 5.0000 mg | ORAL_TABLET | Freq: Two times a day (BID) | ORAL | Status: DC

## 2013-06-17 NOTE — Progress Notes (Signed)
[facility] provided pt with a [facility] Card application.  °
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
## 2013-06-17 NOTE — ED Notes (Signed)
Patient was educated not to drive, operate heavy machinery, or drink alcohol while taking narcotic medication.  

## 2013-06-17 NOTE — ED Provider Notes (Signed)
Date: 06/17/2013  Rate: 75  Rhythm: normal sinus rhythm  QRS Axis: normal  Intervals: normal  ST/T Wave abnormalities: normal  Conduction Disutrbances:none  Narrative Interpretation:   Old EKG Reviewed: unchanged    Junius Finner, PA-C 06/17/13 1027

## 2013-06-17 NOTE — ED Provider Notes (Signed)
Medical screening examination/treatment/procedure(s) were performed by non-physician practitioner and as supervising physician I was immediately available for consultation/collaboration.   Dagmar Hait, MD 06/17/13 1539

## 2013-06-17 NOTE — ED Notes (Signed)
Patient reports to ED for right sharp chest pain radiating to right upper back and to right little finger, which feels numb. Patient also complains of some SOB. Patient was seen here on 06/11/13 for same complaint, was advised to follow up with cardiologist, which patient did not do. Denies dizziness, blurred vision.

## 2013-06-17 NOTE — ED Provider Notes (Signed)
Medical screening examination/treatment/procedure(s) were performed by non-physician practitioner and as supervising physician I was immediately available for consultation/collaboration.   Dagmar Hait, MD 06/17/13 1540

## 2014-05-30 ENCOUNTER — Encounter: Payer: Self-pay | Admitting: Family Medicine

## 2014-05-30 ENCOUNTER — Ambulatory Visit (INDEPENDENT_AMBULATORY_CARE_PROVIDER_SITE_OTHER): Payer: Self-pay | Admitting: Family Medicine

## 2014-05-30 VITALS — BP 137/99 | Ht 67.0 in | Wt 135.0 lb

## 2014-05-30 DIAGNOSIS — M545 Low back pain, unspecified: Secondary | ICD-10-CM

## 2014-06-02 DIAGNOSIS — M545 Low back pain, unspecified: Secondary | ICD-10-CM | POA: Insufficient documentation

## 2014-06-02 NOTE — Assessment & Plan Note (Signed)
Her exam is without any distinct pathology. I do not think there is any need for imaging. I suspect her chronic back pain is multifactorial and there is nothing I have to offer her--no injection therapy is warranted, she has no neuropathic signs of weakness so I doubt OPT would be useful. I will not be able to fill out a Functional Capacity Evaluation for her as I find no deficits. She should follow up with her PCP.

## 2014-06-02 NOTE — Progress Notes (Signed)
Patient ID: Lori Houston, female   DOB: 1961/03/17, 53 y.o.   MRN: [id number]  Lori Houston - 53 y.o. female MRN [id number]  Date of birth: 10-31-1960    SUBJECTIVE:     Low back pain since a fall in October 2013. Slipped and fell while attending class at [employer]. Fall was forward, landing on her hands and then her chest. She says she may have "flipped up into the air" but does not recall hitting her head or her neck, no LOC. Was reportedly seen at a local ED and then has been followed by [facility] intermittently since then. She brings a form for Functional Capacity evaluation for me to fill out. She is complaining of constant low back pain, 8/10, not relieved by anything. Says her pain level has essentially kept her from finding employment because she cannot walk or stand for long periods of time. Symptoms of low back pain are unchanged since 2013. Denies bowel or bladderincontinence. Pain does not radiate into legs or groin. ROS:     Denies unusual weight loss, says she has had multiple falls secondary to weakness ofher lower extremities that is intermittent--legs just"give way". No numbness in her lower extremities  PERTINENT  PMH / PSH FH / / SH:  Past Medical, Surgical, Social, and Family History Reviewed & Updated in the EMR.  Pertinent findings include:  Hs rigt radial fracture s/p ORIF Hx ulnar styloid fracture S/p BTL Daily smoker 1-2 alcoholic beverages per week  OBJECTIVE: BP 137/99  Ht  (1.702 m)  Wt 135 lb (61.236 kg)  BMI 21.14 kg/m2  Physical Exam:  Vital signs are reviewed. WD thin female, NAD. She is wearing a back brace. BACK: no defect. No redness or swelling. No unusual curvature.  Vertebra non tender to percussion SLR; negative bilaterally EXT: LE strength at hip flexors and extensors is 5/5. She has FROM and is fairly agile as she recreates for me the specifics of her fall, landing on her hands and kicking her leg (right) up into the air with no difficulty,  then springing back on to her feet wit great balance. Dorsiflexion and plantar flexion strength 5/5, knee extension, flexion, adduction and abduction 5/5. NEURO: DTRs 2+ B= knee and ankle. Sensation soft touch intact B LE and feet. PSYCHIATRIC: pressured speech, content is fairly normal but intermittently tangential. AxOx4.   ASSESSMENT & PLAN:  See problem based charting & AVS for pt instructions.

## 2014-06-17 ENCOUNTER — Ambulatory Visit: Payer: Self-pay

## 2014-06-23 ENCOUNTER — Ambulatory Visit: Payer: Self-pay

## 2014-06-25 ENCOUNTER — Emergency Department (HOSPITAL_COMMUNITY): Payer: Self-pay

## 2014-06-25 ENCOUNTER — Encounter (HOSPITAL_COMMUNITY): Payer: Self-pay | Admitting: Emergency Medicine

## 2014-06-25 ENCOUNTER — Emergency Department (HOSPITAL_COMMUNITY)
Admission: EM | Admit: 2014-06-25 | Discharge: 2014-06-25 | Discharge status: Against Medical Advice | Payer: Self-pay | Attending: Emergency Medicine | Admitting: Emergency Medicine

## 2014-06-25 DIAGNOSIS — Z9851 Tubal ligation status: Secondary | ICD-10-CM | POA: Insufficient documentation

## 2014-06-25 DIAGNOSIS — R1011 Right upper quadrant pain: Secondary | ICD-10-CM | POA: Insufficient documentation

## 2014-06-25 DIAGNOSIS — Z720 Tobacco use: Secondary | ICD-10-CM | POA: Insufficient documentation

## 2014-06-25 DIAGNOSIS — R109 Unspecified abdominal pain: Secondary | ICD-10-CM

## 2014-06-25 DIAGNOSIS — R102 Pelvic and perineal pain: Secondary | ICD-10-CM | POA: Insufficient documentation

## 2014-06-25 DIAGNOSIS — Z8659 Personal history of other mental and behavioral disorders: Secondary | ICD-10-CM | POA: Insufficient documentation

## 2014-06-25 DIAGNOSIS — Z3202 Encounter for pregnancy test, result negative: Secondary | ICD-10-CM | POA: Insufficient documentation

## 2014-06-25 DIAGNOSIS — M549 Dorsalgia, unspecified: Secondary | ICD-10-CM | POA: Insufficient documentation

## 2014-06-25 LAB — COMPREHENSIVE METABOLIC PANEL
ALBUMIN: 3.2 g/dL — AB (ref 3.5–5.2)
ALT: 15 U/L (ref 0–35)
ANION GAP: 9 (ref 5–15)
AST: 16 U/L (ref 0–37)
Alkaline Phosphatase: 87 U/L (ref 39–117)
BUN: 10 mg/dL (ref 6–23)
CALCIUM: 8.5 mg/dL (ref 8.4–10.5)
CHLORIDE: 107 meq/L (ref 96–112)
CO2: 25 mEq/L (ref 19–32)
CREATININE: 0.99 mg/dL (ref 0.50–1.10)
GFR calc Af Amer: 74 mL/min — ABNORMAL LOW (ref >90)
GFR calc non Af Amer: 64 mL/min — ABNORMAL LOW (ref >90)
Glucose, Bld: 95 mg/dL (ref 70–99)
Potassium: 4.2 mEq/L (ref 3.7–5.3)
Sodium: 141 mEq/L (ref 137–147)
TOTAL PROTEIN: 6.6 g/dL (ref 6.0–8.3)
Total Bilirubin: 0.4 mg/dL (ref 0.3–1.2)

## 2014-06-25 LAB — CBC WITH DIFFERENTIAL/PLATELET
BASOS ABS: 0 10*3/uL (ref 0.0–0.1)
BASOS PCT: 0 % (ref 0–1)
EOS ABS: 0.1 10*3/uL (ref 0.0–0.7)
EOS PCT: 2 % (ref 0–5)
HEMATOCRIT: 43.8 % (ref 36.0–46.0)
Hemoglobin: 15.1 g/dL — ABNORMAL HIGH (ref 12.0–15.0)
Lymphocytes Relative: 21 % (ref 12–46)
Lymphs Abs: 1.6 10*3/uL (ref 0.7–4.0)
MCH: 32.3 pg (ref 26.0–34.0)
MCHC: 34.5 g/dL (ref 30.0–36.0)
MCV: 93.6 fL (ref 78.0–100.0)
MONO ABS: 0.6 10*3/uL (ref 0.1–1.0)
Monocytes Relative: 7 % (ref 3–12)
NEUTROS ABS: 5.5 10*3/uL (ref 1.7–7.7)
Neutrophils Relative %: 70 % (ref 43–77)
Platelets: 379 10*3/uL (ref 150–400)
RBC: 4.68 MIL/uL (ref 3.87–5.11)
RDW: 14.5 % (ref 11.5–15.5)
WBC: 7.8 10*3/uL (ref 4.0–10.5)

## 2014-06-25 LAB — URINALYSIS, ROUTINE W REFLEX MICROSCOPIC
Bilirubin Urine: NEGATIVE
Glucose, UA: NEGATIVE mg/dL
Hgb urine dipstick: NEGATIVE
Ketones, ur: NEGATIVE mg/dL
LEUKOCYTES UA: NEGATIVE
NITRITE: NEGATIVE
PH: 6.5 (ref 5.0–8.0)
Protein, ur: NEGATIVE mg/dL
SPECIFIC GRAVITY, URINE: 1.022 (ref 1.005–1.030)
UROBILINOGEN UA: 0.2 mg/dL (ref 0.0–1.0)

## 2014-06-25 LAB — LIPASE, BLOOD: LIPASE: 30 U/L (ref 11–59)

## 2014-06-25 LAB — WET PREP, GENITAL
Clue Cells Wet Prep HPF POC: NONE SEEN
TRICH WET PREP: NONE SEEN
WBC, Wet Prep HPF POC: NONE SEEN
Yeast Wet Prep HPF POC: NONE SEEN

## 2014-06-25 LAB — HIV ANTIBODY (ROUTINE TESTING W REFLEX): HIV 1&2 Ab, 4th Generation: NONREACTIVE

## 2014-06-25 LAB — POC URINE PREG, ED: Preg Test, Ur: NEGATIVE

## 2014-06-25 MED ORDER — HYDROCODONE-ACETAMINOPHEN 5-325 MG PO TABS: 1.0000 | ORAL_TABLET | Freq: Four times a day (QID) | ORAL | Status: DC | PRN

## 2014-06-25 MED ORDER — OXYCODONE-ACETAMINOPHEN 5-325 MG PO TABS: 2.0000 | ORAL_TABLET | ORAL | Status: DC | PRN

## 2014-06-25 MED ORDER — OXYCODONE-ACETAMINOPHEN 5-325 MG PO TABS
1.0000 | ORAL_TABLET | Freq: Once | ORAL | Status: AC
  Administered 2014-06-25: 1 via ORAL
  Filled 2014-06-25: qty 1

## 2014-06-25 NOTE — ED Provider Notes (Signed)
CSN: [id number]     Arrival date & time 06/25/14  1203 History   First MD Initiated Contact with Patient 06/25/14 1209     Chief Complaint  Patient presents with  . Abdominal Pain     (Consider location/radiation/quality/duration/timing/severity/associated sxs/prior Treatment) The history is provided by the patient. No language interpreter was used.  Lori Houston is a 53 y/o F with PMHx of bruxisum presenting to the ED with right pelvic pain that has been ongoing for the past couple of days. Patient reported that the pain is localized to the right pelvic that has been constant for the past 2-3 days - stated that the pain feels like cramping with radiation to the right side of the back and down the right leg. Patient reported that she has also been experiencing hot flashes intermittently. Stated that she not had a menstrual cycle in 3 months. Stated that she has been using Ibuprofen with minimal relief. Denied hematuria, vaginal bleeding, vaginal discharge, dysuria, melena, hematochezia, nausea, vomiting, diarrhea, neck pain, neck stiffness, chest pain, shortness of breath, difficulty breathing, difficulty swallowing. PCP none  Past Medical History  Diagnosis Date  . Bruxism    Past Surgical History  Procedure Laterality Date  . Wrist reconstruction    . Tubal ligation     No family history on file. History  Substance Use Topics  . Smoking status: Current Every Day Smoker -- 1.00 packs/day    Types: Cigarettes  . Smokeless tobacco: Not on file  . Alcohol Use: 1.2 oz/week    2 Cans of beer per week     Comment: daily   OB History   Grav Para Term Preterm Abortions TAB SAB Ect Mult Living   3    1          Review of Systems  Constitutional: Negative for fever and chills.  Respiratory: Negative for chest tightness and shortness of breath.   Gastrointestinal: Positive for abdominal pain. Negative for nausea, vomiting, diarrhea, constipation, blood in stool and anal bleeding.   Genitourinary: Negative for dysuria, vaginal bleeding, vaginal discharge and vaginal pain.  Musculoskeletal: Positive for back pain. Negative for neck pain.  Neurological: Negative for dizziness, weakness and headaches.      Allergies  Review of patient's allergies indicates no known allergies.  Home Medications   Prior to Admission medications   Medication Sig Start Date End Date Taking? Authorizing Provider  ibuprofen (ADVIL,MOTRIN) 200 MG tablet Take 600 mg by mouth every 6 (six) hours as needed for moderate pain.   Yes Historical Provider, MD  HYDROcodone-acetaminophen (NORCO/VICODIN) 5-325 MG per tablet Take 1 tablet by mouth every 6 (six) hours as needed for moderate pain or severe pain. 06/25/14   Jaeley Wiker, PA-C   BP 143/94  Pulse 91  Temp(Src) 98.6 F (37 C)  Resp 15  SpO2 99%  LMP 04/25/2014 Physical Exam  Nursing note and vitals reviewed. Constitutional: She is oriented to person, place, and time. She appears well-developed and well-nourished. No distress.  HENT:  Head: Normocephalic and atraumatic.  Mouth/Throat: Oropharynx is clear and moist. No oropharyngeal exudate.  Eyes: Conjunctivae and EOM are normal. Pupils are equal, round, and reactive to light. Right eye exhibits no discharge. Left eye exhibits no discharge.  Neck: Normal range of motion. Neck supple. No tracheal deviation present.  Cardiovascular: Normal rate, regular rhythm and normal heart sounds.  Exam reveals no friction rub.   No murmur heard. Pulses:      Radial pulses  are 2+ on the right side, and 2+ on the left side.       Dorsalis pedis pulses are 2+ on the right side, and 2+ on the left side.  Pulmonary/Chest: Effort normal and breath sounds normal. No respiratory distress. She has no wheezes. She has no rales.  Abdominal: Soft. Bowel sounds are normal. She exhibits no distension. There is tenderness. There is no rebound and no guarding.  Negative abdominal distension  BS normoactive  in all 4 quadrants  Discomfort upon palpation to the RUQ and right pelvic region Negative peritoneal signs  Genitourinary:  Pelvic Exam: Negative swelling, erythema, inflammation, lesions, sores, deformities noted to the external genitalia. Negative blood in the vaginal vault. Thick white discharge noted on exam. Mild pressure upon palpation to the suprapubic region. Negative left adnexal tenderness. Pain upon palpation to the right adnexal region.  Exam chaperoned with Tech.  Musculoskeletal: Normal range of motion. She exhibits no edema and no tenderness.  Full ROM to upper and lower extremities without difficulty noted, negative ataxia noted.  Lymphadenopathy:    She has no cervical adenopathy.  Neurological: She is alert and oriented to person, place, and time. No cranial nerve deficit. She exhibits normal muscle tone. Coordination normal.  Cranial nerves III-XII grossly intact Strength 5+/5+ to upper and lower extremities bilaterally with resistance applied, equal distribution noted Patient follows commands well  Patient responds to questions appropriately  Gait proper with negative step-off or sway - negative red flags  Skin: Skin is warm and dry. No rash noted. She is not diaphoretic. No erythema.  Psychiatric: She has a normal mood and affect. Her behavior is normal. Thought content normal.    ED Course  Procedures (including critical care time)  Results for orders placed during the hospital encounter of 06/25/14  WET PREP, GENITAL      Result Value Ref Range   Yeast Wet Prep HPF POC NONE SEEN  NONE SEEN   Trich, Wet Prep NONE SEEN  NONE SEEN   Clue Cells Wet Prep HPF POC NONE SEEN  NONE SEEN   WBC, Wet Prep HPF POC NONE SEEN  NONE SEEN  CBC WITH DIFFERENTIAL      Result Value Ref Range   WBC 7.8  4.0 - 10.5 K/uL   RBC 4.68  3.87 - 5.11 MIL/uL   Hemoglobin 15.1 (*) 12.0 - 15.0 g/dL   HCT 16.1  09.6 - 04.5 %   MCV 93.6  78.0 - 100.0 fL   MCH 32.3  26.0 - 34.0 pg   MCHC  34.5  30.0 - 36.0 g/dL   RDW 40.9  81.1 - 91.4 %   Platelets 379  150 - 400 K/uL   Neutrophils Relative % 70  43 - 77 %   Neutro Abs 5.5  1.7 - 7.7 K/uL   Lymphocytes Relative 21  12 - 46 %   Lymphs Abs 1.6  0.7 - 4.0 K/uL   Monocytes Relative 7  3 - 12 %   Monocytes Absolute 0.6  0.1 - 1.0 K/uL   Eosinophils Relative 2  0 - 5 %   Eosinophils Absolute 0.1  0.0 - 0.7 K/uL   Basophils Relative 0  0 - 1 %   Basophils Absolute 0.0  0.0 - 0.1 K/uL  COMPREHENSIVE METABOLIC PANEL      Result Value Ref Range   Sodium 141  137 - 147 mEq/L   Potassium 4.2  3.7 - 5.3 mEq/L   Chloride 107  96 - 112 mEq/L   CO2 25  19 - 32 mEq/L   Glucose, Bld 95  70 - 99 mg/dL   BUN 10  6 - 23 mg/dL   Creatinine, Ser 0.99  0.1.6150 - 1.10 mg/dL   Calcium 8.5  8.4 - 09.610.5 mg/dL   Total Protein 6.6  6.0 - 8.3 g/dL   Albumin 3.2 (*) 3.5 - 5.2 g/dL   AST 16  0 - 37 U/L   ALT 15  0 - 35 U/L   Alkaline Phosphatase 87  39 - 117 U/L   Total Bilirubin 0.4  0.3 - 1.2 mg/dL   GFR calc non Af Amer 64 (*) >90 mL/min   GFR calc Af Amer 74 (*) >90 mL/min   Anion gap 9  5 - 15  LIPASE, BLOOD      Result Value Ref Range   Lipase 30  11 - 59 U/L  URINALYSIS, ROUTINE W REFLEX MICROSCOPIC      Result Value Ref Range   Color, Urine YELLOW  YELLOW   APPearance CLEAR  CLEAR   Specific Gravity, Urine 1.022  1.005 - 1.030   pH 6.5  5.0 - 8.0   Glucose, UA NEGATIVE  NEGATIVE mg/dL   Hgb urine dipstick NEGATIVE  NEGATIVE   Bilirubin Urine NEGATIVE  NEGATIVE   Ketones, ur NEGATIVE  NEGATIVE mg/dL   Protein, ur NEGATIVE  NEGATIVE mg/dL   Urobilinogen, UA 0.2  0.0 - 1.0 mg/dL   Nitrite NEGATIVE  NEGATIVE   Leukocytes, UA NEGATIVE  NEGATIVE  POC URINE PREG, ED      Result Value Ref Range   Preg Test, Ur NEGATIVE  NEGATIVE    Labs Review Labs Reviewed  CBC WITH DIFFERENTIAL - Abnormal; Notable for the following:    Hemoglobin 15.1 (*)    All other components within normal limits  COMPREHENSIVE METABOLIC PANEL - Abnormal;  Notable for the following:    Albumin 3.2 (*)    GFR calc non Af Amer 64 (*)    GFR calc Af Amer 74 (*)    All other components within normal limits  WET PREP, GENITAL  GC/CHLAMYDIA PROBE AMP  LIPASE, BLOOD  URINALYSIS, ROUTINE W REFLEX MICROSCOPIC  HIV ANTIBODY (ROUTINE TESTING)  POC URINE PREG, ED    Imaging Review [place] Transvaginal Non-ob  06/25/2014   CLINICAL DATA:  Right pelvic pain for the past 3 days. Clinical concern for ovarian torsion.  EXAM: TRANSABDOMINAL AND TRANSVAGINAL ULTRASOUND OF PELVIS  DOPPLER ULTRASOUND OF OVARIES  TECHNIQUE: Both transabdominal and transvaginal ultrasound examinations of the pelvis were performed. Transabdominal technique was performed for global imaging of the pelvis including uterus, ovaries, adnexal regions, and pelvic cul-de-sac.  It was necessary to proceed with endovaginal exam following the transabdominal exam to visualize the ovaries. Color and duplex Doppler ultrasound was utilized to evaluate blood flow to the ovaries.  COMPARISON:  None.  FINDINGS: Uterus  Measurements: 8.2 x 1 4.7 x 4.5 cm. There are 4 rounded and oval myometrial masses measuring up to 2.3 cm in maximum diameter each. None of these appear to have a submucosal component.  Endometrium  Thickness: 3.0 mm.  No focal abnormality visualized.  Right ovary  Measurements: 2.2 x 1.9 x 0.9 cm. Normal appearance/no adnexal mass.  Left ovary  Measurements: 2.0 x 1.2 x 1.1 cm. Normal appearance/no adnexal mass.  Pulsed Doppler evaluation of both ovaries demonstrates normal low-resistance arterial and venous waveforms.  Other findings  Trace amount of  free peritoneal fluid adjacent to the right ovary.  IMPRESSION: 1. No acute abnormality. 2. Small uterine fibroids.   Electronically Signed   By: Gordan Payment M.D.   On: 06/25/2014 15:40   US Pelvis Complete  06/25/2014   CLINICAL DATA:  Right pelvic pain for the past 3 days. Clinical concern for ovarian torsion.  EXAM: TRANSABDOMINAL AND  TRANSVAGINAL ULTRASOUND OF PELVIS  DOPPLER ULTRASOUND OF OVARIES  TECHNIQUE: Both transabdominal and transvaginal ultrasound examinations of the pelvis were performed. Transabdominal technique was performed for global imaging of the pelvis including uterus, ovaries, adnexal regions, and pelvic cul-de-sac.  It was necessary to proceed with endovaginal exam following the transabdominal exam to visualize the ovaries. Color and duplex Doppler ultrasound was utilized to evaluate blood flow to the ovaries.  COMPARISON:  None.  FINDINGS: Uterus  Measurements: 8.2 x 1 4.7 x 4.5 cm. There are 4 rounded and oval myometrial masses measuring up to 2.3 cm in maximum diameter each. None of these appear to have a submucosal component.  Endometrium  Thickness: 3.0 mm.  No focal abnormality visualized.  Right ovary  Measurements: 2.2 x 1.9 x 0.9 cm. Normal appearance/no adnexal mass.  Left ovary  Measurements: 2.0 x 1.2 x 1.1 cm. Normal appearance/no adnexal mass.  Pulsed Doppler evaluation of both ovaries demonstrates normal low-resistance arterial and venous waveforms.  Other findings  Trace amount of free peritoneal fluid adjacent to the right ovary.  IMPRESSION: 1. No acute abnormality. 2. Small uterine fibroids.   Electronically Signed   By: Gordan Payment M.D.   On: 06/25/2014 15:40   [place] Art/ven Flow Abd Pelv Doppler  06/25/2014   CLINICAL DATA:  Right pelvic pain for the past 3 days. Clinical concern for ovarian torsion.  EXAM: TRANSABDOMINAL AND TRANSVAGINAL ULTRASOUND OF PELVIS  DOPPLER ULTRASOUND OF OVARIES  TECHNIQUE: Both transabdominal and transvaginal ultrasound examinations of the pelvis were performed. Transabdominal technique was performed for global imaging of the pelvis including uterus, ovaries, adnexal regions, and pelvic cul-de-sac.  It was necessary to proceed with endovaginal exam following the transabdominal exam to visualize the ovaries. Color and duplex Doppler ultrasound was utilized to evaluate blood  flow to the ovaries.  COMPARISON:  None.  FINDINGS: Uterus  Measurements: 8.2 x 1 4.7 x 4.5 cm. There are 4 rounded and oval myometrial masses measuring up to 2.3 cm in maximum diameter each. None of these appear to have a submucosal component.  Endometrium  Thickness: 3.0 mm.  No focal abnormality visualized.  Right ovary  Measurements: 2.2 x 1.9 x 0.9 cm. Normal appearance/no adnexal mass.  Left ovary  Measurements: 2.0 x 1.2 x 1.1 cm. Normal appearance/no adnexal mass.  Pulsed Doppler evaluation of both ovaries demonstrates normal low-resistance arterial and venous waveforms.  Other findings  Trace amount of free peritoneal fluid adjacent to the right ovary.  IMPRESSION: 1. No acute abnormality. 2. Small uterine fibroids.   Electronically Signed   By: Gordan Payment M.D.   On: 06/25/2014 15:40   US Abdomen Limited Ruq  06/25/2014   CLINICAL DATA:  Right upper quadrant pain 4 days.  EXAM: US ABDOMEN LIMITED - RIGHT UPPER QUADRANT  COMPARISON:  None.  FINDINGS: Gallbladder:  No gallstones or wall thickening visualized. No sonographic Murphy sign noted.  Common bile duct:  Diameter: 5.4 mm  Liver:  No focal lesion identified. Within normal limits in parenchymal echogenicity.  IMPRESSION: No acute hepatobiliary disease.   Electronically Signed   By: Elberta Fortis M.D.  On: 06/25/2014 15:29     EKG Interpretation None      MDM   Final diagnoses:  RUQ pain  Pelvic pain in female  Abdominal cramping    Medications  oxyCODONE-acetaminophen (PERCOCET/ROXICET) 5-325 MG per tablet 1 tablet (1 tablet Oral Given 06/25/14 1604)    Filed Vitals:   06/25/14 1207 06/25/14 1534 06/25/14 1653  BP: 116/89 153/90 143/94  Pulse: 95 85 91  Temp: 98.6 F (37 C)    Resp: 20 15   SpO2: 98% 100% 99%   CBC unremarkable, mildly elevated hemoglobin of 15.1. CMP unremarkable. Lipase negative elevation. Urine pregnancy negative. Urinalysis unremarkable. Wet prep clear-negative findings of infection. GC/Chlamydia  probe pending. HIV pending. Abdominal ultrasound unremarkable. Negative for gallstones or wall thickening. Pelvic ultrasound no acute abnormalities noted, small uterine fibroids. Negative findings of ovarian torsion. Doubt TOA. Doubt PID. Doubt UTI/pyelonephritis. Negative findings of cholangitis/cholecystitis. Doubt pancreatitis. Patient depicted right groin and buttock pain as well - could be possible sciatica since patient does have history of chronic back pain. Patient mildly improved with Percocet. Since [place] negative, this provider recommended CT abdomen and pelvis with contrast to rule out possible appendicitis. Patient reported that she was unable to stay secondary to her ride leaving her. Reported that she will come back to emergency department tomorrow for CT scan to be performed. This provider discussed concern and consequences - patient understood. Patient reported that she will return tomorrow. Patient to be signed out AMA. Patient prescribed pain medication - discussed course, precautions, disposal technique. Discussed with patient to closely monitor symptoms and if symptoms are to worsen or change to report back to the ED - strict return instructions given.  Patient agreed to plan of care, understood, all questions answered.   Raymon Mutton, PA-C 06/25/14 1718

## 2014-06-25 NOTE — Discharge Instructions (Signed)
Please call your doctor for a followup appointment within 24-48 hours. When you talk to your doctor please let them know that you were seen in the emergency department and have them acquire all of your records so that they can discuss the findings with you and formulate a treatment plan to fully care for your new and ongoing problems. Please call and set-up an appointment with [facility]  Please rest and stay hydrated This provider recommended a CT of the stomach to make sure no infection, but you were not able to stay. If symptoms worsening please report back to the ED - please report back to the ED for a CT Please take medications as prescribed - while on pain medications there is to be no drinking alcohol, driving, operating any heavy machinery. If extra please dispose in a proper manner. Please do not take any extra Tylenol with this medication for this can lead to Tylenol overdose and liver issues.  Please continue to monitor symptoms closely and if symptoms are to worsen or change (fever greater than 101, chills, sweating, nausea, vomiting, chest pain, shortness of breathe, difficulty breathing, weakness, numbness, tingling, worsening or changes to pain pattern, inability to keep food or fluids down, diarrhea, black tarry stools) please report back to the Emergency Department immediately.    Abdominal Pain Many things can cause abdominal pain. Usually, abdominal pain is not caused by a disease and will improve without treatment. It can often be observed and treated at home. Your health care provider will do a physical exam and possibly order blood tests and X-rays to help determine the seriousness of your pain. However, in many cases, more time must pass before a clear cause of the pain can be found. Before that point, your health care provider may not know if you need more testing or further treatment. HOME CARE INSTRUCTIONS  Monitor your abdominal pain for any changes.  The following actions may help to alleviate any discomfort you are experiencing:  Only take over-the-counter or prescription medicines as directed by your health care provider.  Do not take laxatives unless directed to do so by your health care provider.  Try a clear liquid diet (broth, tea, or water) as directed by your health care provider. Slowly move to a bland diet as tolerated. SEEK MEDICAL CARE IF:  You have unexplained abdominal pain.  You have abdominal pain associated with nausea or diarrhea.  You have pain when you urinate or have a bowel movement.  You experience abdominal pain that wakes you in the night.  You have abdominal pain that is worsened or improved by eating food.  You have abdominal pain that is worsened with eating fatty foods.  You have a fever. SEEK IMMEDIATE MEDICAL CARE IF:   Your pain does not go away within 2 hours.  You keep throwing up (vomiting).  Your pain is felt only in portions of the abdomen, such as the right side or the left lower portion of the abdomen.  You pass bloody or black tarry stools. MAKE SURE YOU:  Understand these instructions.   Will watch your condition.   Will get help right away if you are not doing well or get worse.  Document Released: 06/05/2005 Document Revised: 08/31/2013 Document Reviewed: 05/05/2013 [facility] Patient Information 2015 [place], [place]. This information is not intended to replace advice given to you by your health care provider. Make sure you discuss any questions you have with your health care provider.  Menopause Menopause is the normal time of life when menstrual periods stop completely. Menopause is complete when you have missed 12 consecutive menstrual periods. It usually occurs between the ages of 48 years and 55 years. Very rarely does a woman develop menopause before the age of 40 years. At menopause, your ovaries stop producing the female hormones estrogen and progesterone. This can  cause undesirable symptoms and also affect your health. Sometimes the symptoms may occur 4-5 years before the menopause begins. There is no relationship between menopause and:  Oral contraceptives.  Number of children you had.  Race.  The age your menstrual periods started (menarche). Heavy smokers and very thin women may develop menopause earlier in life. CAUSES  The ovaries stop producing the female hormones estrogen and progesterone.  Other causes include:  Surgery to remove both ovaries.  The ovaries stop functioning for no known reason.  Tumors of the pituitary gland in the brain.  Medical disease that affects the ovaries and hormone production.  Radiation treatment to the abdomen or pelvis.  Chemotherapy that affects the ovaries. SYMPTOMS   Hot flashes.  Night sweats.  Decrease in sex drive.  Vaginal dryness and thinning of the vagina causing painful intercourse.  Dryness of the skin and developing wrinkles.  Headaches.  Tiredness.  Irritability.  Memory problems.  Weight gain.  Bladder infections.  Hair growth of the face and chest.  Infertility. More serious symptoms include:  Loss of bone (osteoporosis) causing breaks (fractures).  Depression.  Hardening and narrowing of the arteries (atherosclerosis) causing heart attacks and strokes. DIAGNOSIS   When the menstrual periods have stopped for 12 straight months.  Physical exam.  Hormone studies of the blood. TREATMENT  There are many treatment choices and nearly as many questions about them. The decisions to treat or not to treat menopausal changes is an individual choice made with your health care provider. Your health care provider can discuss the treatments with you. Together, you can decide which treatment will work best for you. Your treatment choices may include:   Hormone therapy (estrogen and progesterone).  Non-hormonal medicines.  Treating the individual symptoms with  medicine (for example antidepressants for depression).  Herbal medicines that may help specific symptoms.  Counseling by a psychiatrist or psychologist.  Group therapy.  Lifestyle changes including:  Eating healthy.  Regular exercise.  Limiting caffeine and alcohol.  Stress management and meditation.  No treatment. HOME CARE INSTRUCTIONS   Take the medicine your health care provider gives you as directed.  Get plenty of sleep and rest.  Exercise regularly.  Eat a diet that contains calcium (good for the bones) and soy products (acts like estrogen hormone).  Avoid alcoholic beverages.  Do not smoke.  If you have hot flashes, dress in layers.  Take supplements, calcium, and vitamin D to strengthen bones.  You can use over-the-counter lubricants or moisturizers for vaginal dryness.  Group therapy is sometimes very helpful.  Acupuncture may be helpful in some cases. SEEK MEDICAL CARE IF:   You are not sure you are in menopause.  You are having menopausal symptoms and need advice and treatment.  You are still having menstrual periods after age 51 years.  You have pain with intercourse.  Menopause is complete (no menstrual period for 12 months) and you develop vaginal bleeding.  You need a referral to a specialist (gynecologist, psychiatrist, or psychologist) for treatment. SEEK IMMEDIATE MEDICAL CARE IF:   You have severe depression.  You have excessive vaginal bleeding.  You fell and think you have a broken bone.  You have pain when you urinate.  You develop leg or chest pain.  You have a fast pounding heart beat (palpitations).  You have severe headaches.  You develop vision problems.  You feel a lump in your breast.  You have abdominal pain or severe indigestion. Document Released: 11/16/2003 Document Revised: 04/28/2013 Document Reviewed: 03/25/2013 [facility] Patient Information 2015 [place]Care, [place]LL[place]. This information is not intended to  replace advice given to you by your health care provider. Make sure you discuss any questions you have with your health care provider.   Emergency Department Resource Guide 1) Find a Doctor and Pay Out of Pocket Although you won't have to find out who is covered by your insurance plan, it is a good idea to ask around and get recommendations. You will then need to call the office and see if the doctor you have chosen will accept you as a new patient and what types of options they offer for patients who are self-pay. Some doctors offer discounts or will set up payment plans for their patients who do not have insurance, but you will need to ask so you aren't surprised when you get to your appointment.  2) Contact Your Local Health Department Not all health departments have doctors that can see patients for sick visits, but many do, so it is worth a call to see if yours does. If you don't know where your local health department is, you can check in your phone book. The CDC also has a tool to help you locate your state's health department, and many state websites also have listings of all of their local health departments.  3) Find a Walk-in Clinic If your illness is not likely to be very severe or complicated, you may want to try a walk in clinic. These are popping up all over the country in pharmacies, drugstores, and shopping centers. They're usually staffed by nurse practitioners or physician assistants that have been trained to treat common illnesses and complaints. They're usually fairly quick and inexpensive. However, if you have serious medical issues or chronic medical problems, these are probably not your best option.  No Primary Care Doctor: - Call Health Connect at  [phone] - they can help you locate a primary care doctor that  accepts your insurance, provides certain services, etc. - Physician Referral Service- [phone]  Chronic Pain Problems: Organization         Address  Phone    Notes  [facility]  [phone] Patients need to be referred by their primary care doctor.   Medication Assistance: Organization         Address  Phone   Notes  [facility] Medication [facility] [address] [place], [place] [address] [phone] --Must be a resident of [facility] -- Must have NO insurance coverage whatsoever (no Medicaid/ Medicare, etc.) -- The pt. MUST have a primary care doctor that directs their care regularly and follows them in the community   MedAssist  [phone]) [phone]   [employer]  [phone]    Agencies that provide inexpensive medical care: Organization         Address  Phone   Notes  [facility]  [phone]   [facility]    [phone]   [facility] [address] [place], [place] [address] [phone]   [facility]  [place] [address] [place] [phone]   Planned Parenthood    [phone]   [facility]    [phone]   [facility]  [address], [place] Phone:  [phone], Fax:  [phone] Hours of Operation:  9 am - 6 pm, M-F.  Also accepts Medicaid/Medicare and self-pay.  [facility]  [address], [place] Phone: [phone], Fax: [phone]. Hours of Operation:  8:30 am - 5:30 pm, M-F.  Also accepts Medicaid and self-pay.  [facility] [place] [address] [place] Phone: [phone]   [facility] [address] Natasha Bence [place], [place] [phone], Ext. [phone] Mondays & Thursdays: 7-9 AM.  First 15 patients are seen on a first come, first serve basis.    Medicaid-accepting [facility] Providers:  Organization         Address  Phone   Notes  [facility] [address], [place] [phone] Also accepts self-pay patients.  [facility]  [address] Laurell Josephs [place], [place]  [phone]   [facility] [address], [place] [phone]   [facility] [address], [place] [phone]   Renaye Rakers [address], [place]   [phone] Only accepts [place] Access [place] patients after they have their name applied to their card.   Self-Pay (no insurance) in [facility]:  Organization         Address  Phone   Notes  Sickle Cell Patients, [facility] [address] [place], [place] [phone]   [facility] [address] [place], [place] [phone]   [facility]  [address], [place] [phone]   [facility]/Dr. Osei-Bonsu  [address] [place] or [address], [place] [phone] Phone number for both [place] and [place] locations is the same.  [facility] [address] [place] [phone]   [facility] [address] [place] or [address] [phone]   [facility] [address] [place] [phone], phone; [phone], fax Sees patients 1st and 3rd Saturday of every month.  Must not qualify for public or private insurance (i.e. Medicaid, Medicare, [place] Health Choice, Veterans' Benefits)  Household income should be no more than 200% of the poverty level The clinic cannot treat you if you are pregnant or think you are pregnant  Sexually transmitted diseases are not treated at the clinic.    Dental Care: Organization         Address  Phone  Notes  [facility] Department of [facility] [address] [place], [place] [phone] Accepts children up to age 23 who are enrolled in [place] or [place] Health Choice; pregnant women with a Medicaid card; and children who have  applied for Medicaid or [place] Health Choice, but were declined, whose parents can pay a reduced fee at time of service.  [facility] Department of [facility]  [address], [place] [phone] Accepts children up to age 3 who are enrolled in [place] or  Health Choice; pregnant women with a Medicaid card; and children who have  applied for Medicaid or [place] Health Choice, but were declined, whose parents can pay a reduced fee at time of service.  [facility]  [address] [place], [place] [phone] Patients are seen by appointment only. Walk-ins are not accepted. [facility] will see patients 22 years of age and older. Monday - Tuesday (8am-5pm) Most Wednesdays (8:30-5pm) $30 per visit, cash only  [facility] Adult Dental Access PROGRAM  [address], [facility] [phone] Patients are seen by appointment only. Walk-ins are not accepted. [facility] will see patients 14 years of age and older. One Wednesday Evening (Monthly: Volunteer Based).  $30 per visit, cash only  [employer]  [phone] for adults; Children under age 52, call Graduate Pediatric Dentistry at [phone]. Children aged 82-14, please call [phone] to request a pediatric application.  Dental services are provided in all areas of dental care including fillings, crowns and bridges, complete and partial dentures, implants, gum treatment, root canals, and extractions. Preventive care is also provided. Treatment is provided to both adults and children. Patients are selected via a lottery and there is often a waiting list.   [facility] [address] [place]  [phone] [email or web address]   [facility] [address] [place], [place] [phone], Ext. [phone] Second and Fourth Thursday of each month, opens at 6:30 AM; Clinic ends at 9 AM.  Patients are seen on a first-come first-served basis, and a  limited number are seen during each clinic.   [facility]  [address] Ether Griffins [place], [place] [phone]   Eligibility Requirements You must have lived in [place], [place], or [place] counties for at least the last three months.   You cannot be eligible for state or federal sponsored [place], including [employer], [place], or [employer].   You generally cannot be eligible for healthcare insurance through your employer.    How to apply: Eligibility screenings are held every Tuesday and Wednesday afternoon from 1:00 pm until 4:00 pm. You do not need an appointment for the interview!  [facility] [address] [place], [place] [phone]   [facility] Health Department  [phone]   [facility] Health Department  [phone]   [facility] Health Department  [phone]    Behavioral Health Resources in the Community: Intensive Outpatient Programs Organization         Address  Phone  Notes  [facility] [address] [place], [place] [phone]   [facility] [address] [place], [place] [phone]   ADS: Alcohol & Drug Svcs [address], [place], [place]  [phone]   [facility] Mental Health [address],  [place], [place] [phone] or [phone]   Substance Abuse Resources Organization         Address  Phone  Notes  [facility] Drug Services  [phone]   [facility]  [phone]   [facility] [place]  [phone]   Floydene Flock  [phone]   Residential & Outpatient Substance Abuse Program  [phone]   Psychological Services Organization         Address  Phone  Notes  [facility] Behavioral Health  [phone]   [facility] Services  [phone]   [facility] Mental Health [address] [place] [phone] or [phone]    Mobile Crisis Teams Organization  Address  Phone  Notes  [facility], Mobile Crisis Care Unit  [phone]   [facility]  [address] [place], [place]   [facility] [address], [place] [phone]    Self-Help/Support Groups Organization         Address  Phone             Notes  [place]. of [place] - variety of support groups  [place] Call for more information  Narcotics Anonymous (NA), [facility] [address], [employer] [place]  2 meetings at this location   Special educational needs teacher         Address  Phone  Notes  [facility] Residential Treatment [place],    [place]  [phone]   [facility]  [address], [place] [phone], [place], [place]   [place], [place] [phone] Admissions: 8am-3pm M-F  Incentives Substance [place] [address]    [place], [place] [phone]   [facility] [address] [place], [place], [place]   [facility] [address]  [place], [place]   [facility] Programs - Intensive Outpatient [address] Kristeen [address], [place], [place]   [facility] ([place]  [place], [place] [phone] or [phone]   [facility] ([facility]) [address], [place], [place] Accepts Medicaid  [facility] [address],  [place] [phone] Substance Abuse/Addiction Treatment   [facility] Organization         Address  Phone  Notes  [facility]  [phone]   Domenic Schwab, PhD [address] Arlis [place], [place]   [phone] or [phone]   [place], [place] [phone]   [facility] [address] [place], [place] [phone] Insurance/Medicaid/sponsorship  through [facility] and Families [address]                                    [place], [place] [phone] [place] [address], [place] [phone]    Dr. Adele Schilder  [phone]   [facility] Dept. 1) [address], [place] 2) [place] 3)  [place] [address], [place] [phone]  [phone]   [place] [phone] or [phone] (After Hours)

## 2014-06-25 NOTE — ED Notes (Signed)
Pt states she hasn't had a period in 3 months.  But states she is having "menstrual cramps".  RLQ abd pain radiating to back.  No vaginal bleeding/discharge, no dysuria, no NVD.

## 2014-06-26 NOTE — ED Provider Notes (Signed)
Medical screening examination/treatment/procedure(s) were performed by non-physician practitioner and as supervising physician I was immediately available for consultation/collaboration.   EKG Interpretation None        Nakota Elsen, MD 06/26/14 1600 

## 2014-06-27 LAB — GC/CHLAMYDIA PROBE AMP
CT PROBE, AMP APTIMA: NEGATIVE
GC PROBE AMP APTIMA: NEGATIVE

## 2014-07-11 ENCOUNTER — Encounter (HOSPITAL_COMMUNITY): Payer: Self-pay | Admitting: Emergency Medicine

## 2015-10-17 ENCOUNTER — Emergency Department (HOSPITAL_COMMUNITY): Payer: Self-pay

## 2015-10-17 ENCOUNTER — Emergency Department (HOSPITAL_COMMUNITY)
Admission: EM | Admit: 2015-10-17 | Discharge: 2015-10-17 | Disposition: A | Discharge status: Home / Self Care | Payer: Self-pay | Attending: Emergency Medicine | Admitting: Emergency Medicine

## 2015-10-17 ENCOUNTER — Encounter (HOSPITAL_COMMUNITY): Payer: Self-pay | Admitting: Emergency Medicine

## 2015-10-17 DIAGNOSIS — B9789 Other viral agents as the cause of diseases classified elsewhere: Secondary | ICD-10-CM

## 2015-10-17 DIAGNOSIS — F17210 Nicotine dependence, cigarettes, uncomplicated: Secondary | ICD-10-CM | POA: Insufficient documentation

## 2015-10-17 DIAGNOSIS — H9209 Otalgia, unspecified ear: Secondary | ICD-10-CM | POA: Insufficient documentation

## 2015-10-17 DIAGNOSIS — R079 Chest pain, unspecified: Secondary | ICD-10-CM | POA: Insufficient documentation

## 2015-10-17 DIAGNOSIS — K029 Dental caries, unspecified: Secondary | ICD-10-CM | POA: Insufficient documentation

## 2015-10-17 DIAGNOSIS — K002 Abnormalities of size and form of teeth: Secondary | ICD-10-CM | POA: Insufficient documentation

## 2015-10-17 DIAGNOSIS — Z8659 Personal history of other mental and behavioral disorders: Secondary | ICD-10-CM | POA: Insufficient documentation

## 2015-10-17 DIAGNOSIS — J069 Acute upper respiratory infection, unspecified: Secondary | ICD-10-CM

## 2015-10-17 DIAGNOSIS — K047 Periapical abscess without sinus: Secondary | ICD-10-CM

## 2015-10-17 MED ORDER — IBUPROFEN 200 MG PO TABS
400.0000 mg | ORAL_TABLET | Freq: Once | ORAL | Status: AC
  Administered 2015-10-17: 400 mg via ORAL
  Filled 2015-10-17: qty 2

## 2015-10-17 MED ORDER — PENICILLIN V POTASSIUM 250 MG PO TABS: 500.0000 mg | ORAL_TABLET | Freq: Four times a day (QID) | ORAL | Status: AC

## 2015-10-17 MED ORDER — IPRATROPIUM-ALBUTEROL 0.5-2.5 (3) MG/3ML IN SOLN
3.0000 mL | Freq: Once | RESPIRATORY_TRACT | Status: AC
  Administered 2015-10-17: 3 mL via RESPIRATORY_TRACT
  Filled 2015-10-17: qty 3

## 2015-10-17 NOTE — Discharge Instructions (Signed)
Dental Abscess °A dental abscess is a collection of pus in or around a tooth. °CAUSES °This condition is caused by a bacterial infection around the root of the tooth that involves the inner part of the tooth (pulp). It may result from: °· Severe tooth decay. °· Trauma to the tooth that allows bacteria to enter into the pulp, such as a broken or chipped tooth. °· Severe gum disease around a tooth. °SYMPTOMS °Symptoms of this condition include: °· Severe pain in and around the infected tooth. °· Swelling and redness around the infected tooth, in the mouth, or in the face. °· Tenderness. °· Pus drainage. °· Bad breath. °· Bitter taste in the mouth. °· Difficulty swallowing. °· Difficulty opening the mouth. °· Nausea. °· Vomiting. °· Chills. °· Swollen neck glands. °· Fever. °DIAGNOSIS °This condition is diagnosed with examination of the infected tooth. During the exam, your dentist may tap on the infected tooth. Your dentist will also ask about your medical and dental history and may order X-rays. °TREATMENT °This condition is treated by eliminating the infection. This may be done with: °· Antibiotic medicine. °· A root canal. This may be performed to save the tooth. °· Pulling (extracting) the tooth. This may also involve draining the abscess. This is done if the tooth cannot be saved. °HOME CARE INSTRUCTIONS °· Take medicines only as directed by your dentist. °· If you were prescribed antibiotic medicine, finish all of it even if you start to feel better. °· Rinse your mouth (gargle) often with salt water to relieve pain or swelling. °· Do not drive or operate heavy machinery while taking pain medicine. °· Do not apply heat to the outside of your mouth. °· Keep all follow-up visits as directed by your dentist. This is important. °SEEK MEDICAL CARE IF: °· Your pain is worse and is not helped by medicine. °SEEK IMMEDIATE MEDICAL CARE IF: °· You have a fever or chills. °· Your symptoms suddenly get worse. °· You have a  very bad headache. °· You have problems breathing or swallowing. °· You have trouble opening your mouth. °· You have swelling in your neck or around your eye. °  °This information is not intended to replace advice given to you by your health care provider. Make sure you discuss any questions you have with your health care provider. °  °Document Released: 08/26/2005 Document Revised: 01/10/2015 Document Reviewed: 08/23/2014 °Elsevier Interactive Patient Education ©2016 [employer] ° °Dental Care and Dentist Visits °Dental care supports good overall health. Regular dental visits can also help you avoid dental pain, bleeding, infection, and other more serious health problems in the future. It is important to keep the mouth healthy because diseases in the teeth, gums, and other oral tissues can spread to other areas of the body. Some problems, such as diabetes, heart disease, and pre-term labor have been associated with poor oral health.  °See your dentist every 6 months. If you experience emergency problems such as a toothache or broken tooth, go to the dentist right away. If you see your dentist regularly, you may catch problems early. It is easier to be treated for problems in the early stages.  °WHAT TO EXPECT AT A DENTIST VISIT  °Your dentist will look for many common oral health problems and recommend proper treatment. At your regular dental visit, you can expect: °· Gentle cleaning of the teeth and gums. This includes scraping and polishing. This helps to remove the sticky substance around the teeth and gums (  plaque). Plaque forms in the mouth shortly after eating. Over time, plaque hardens on the teeth as tartar. If tartar is not removed regularly, it can cause problems. Cleaning also helps remove stains.  Periodic X-rays. These pictures of the teeth and supporting bone will help your dentist assess the health of your teeth.  Periodic fluoride treatments. Fluoride is a natural mineral shown to help  strengthen teeth. Fluoride treatmentinvolves applying a fluoride gel or varnish to the teeth. It is most commonly done in children.  Examination of the mouth, tongue, jaws, teeth, and gums to look for any oral health problems, such as:  Cavities (dental caries). This is decay on the tooth caused by plaque, sugar, and acid in the mouth. It is best to catch a cavity when it is small.  Inflammation of the gums caused by plaque buildup (gingivitis).  Problems with the mouth or malformed or misaligned teeth.  Oral cancer or other diseases of the soft tissues or jaws. KEEP YOUR TEETH AND GUMS HEALTHY For healthy teeth and gums, follow these general guidelines as well as your dentist's specific advice:  Have your teeth professionally cleaned at the dentist every 6 months.  Brush twice daily with a fluoride toothpaste.  Floss your teeth daily.  Ask your dentist if you need fluoride supplements, treatments, or fluoride toothpaste.  Eat a healthy diet. Reduce foods and drinks with added sugar.  Avoid smoking. TREATMENT FOR ORAL HEALTH PROBLEMS If you have oral health problems, treatment varies depending on the conditions present in your teeth and gums.  Your caregiver will most likely recommend good oral hygiene at each visit.  For cavities, gingivitis, or other oral health disease, your caregiver will perform a procedure to treat the problem. This is typically done at a separate appointment. Sometimes your caregiver will refer you to another dental specialist for specific tooth problems or for surgery. SEEK IMMEDIATE DENTAL CARE IF:  You have pain, bleeding, or soreness in the gum, tooth, jaw, or mouth area.  A permanent tooth becomes loose or separated from the gum socket.  You experience a blow or injury to the mouth or jaw area.   This information is not intended to replace advice given to you by your health care provider. Make sure you discuss any questions you have with your  health care provider.   Document Released: 05/08/2011 Document Revised: 11/18/2011 Document Reviewed: 05/08/2011 Elsevier Interactive Patient Education 2016 [employer] Inc.  Viral Infections A virus is a type of germ. Viruses can cause:  Minor sore throats.  Aches and pains.  Headaches.  Runny nose.  Rashes.  Watery eyes.  Tiredness.  Coughs.  Loss of appetite.  Feeling sick to your stomach (nausea).  Throwing up (vomiting).  Watery poop (diarrhea). HOME CARE   Only take medicines as told by your doctor.  Drink enough water and fluids to keep your pee (urine) clear or pale yellow. Sports drinks are a good choice.  Get plenty of rest and eat healthy. Soups and broths with crackers or rice are fine. GET HELP RIGHT AWAY IF:   You have a very bad headache.  You have shortness of breath.  You have chest pain or neck pain.  You have an unusual rash.  You cannot stop throwing up.  You have watery poop that does not stop.  You cannot keep fluids down.  You or your child has a temperature by mouth above 102 F (38.9 C), not controlled by medicine.  Your baby is older  than 3 months with a rectal temperature of 102 F (38.9 C) or higher.  Your baby is 35 months old or younger with a rectal temperature of 100.4 F (38 C) or higher. MAKE SURE YOU:   Understand these instructions.  Will watch this condition.  Will get help right away if you are not doing well or get worse.   This information is not intended to replace advice given to you by your health care provider. Make sure you discuss any questions you have with your health care provider.  Follow up with a dentist as soon as possible for consultation and reevaluation. You may require several tooth extractions. Follow-up with her primary care doctor if your respiratory symptoms do not improve. Encouraged smoking cessation. Recommend OTC Mucinex or Sudafed for decongestant. Take ibuprofen as needed for  generalized body aches. Return to the emergency department if you experience severe worsening of your symptoms, fever, facial swelling, swelling under the tongue, difficulty breathing or swallowing, loss of consciousness, ear pain.

## 2015-10-17 NOTE — ED Provider Notes (Signed)
CSN: [id number]     Arrival date & time 10/17/15  1405 History  By signing my name below, I, Freida Busman, attest that this documentation has been prepared under the direction and in the presence of non-physician practitioner, Gaylyn Rong, PA-C. Electronically Signed: Freida Busman, Scribe. 10/17/2015. 5:32 PM.    Chief Complaint  Patient presents with  . Cough  . Generalized Body Aches     The history is provided by the patient. No language interpreter was used.    HPI Comments:  Lori Houston is a 55 y.o. female with no significant pmhx who presents to the Emergency Department complaining of cough, that is occasionally productive, x 2 days. She reports associated sore throat and diffuse CP secondary to cough, chills, generalized body aches, and ear pain. She denies fever, nausea, and vomiting. She notes recent sick contacts with similar symptoms. No alleviating factors noted. Pt smokes a little less than 1 ppd. Pt took some ibuprofen while in the waiting room with moderate relief of symptoms. Denies neck pain/stiffness.  Pt has a h/o bruxism; she also reports right upper dental pain x 1 month. No alleviating factors noted. Pt was told that she had a dental abscess. Pt is in the process of getting an orange card so that she can go see a dentist.   Past Medical History  Diagnosis Date  . Bruxism    Past Surgical History  Procedure Laterality Date  . Wrist reconstruction    . Tubal ligation     No family history on file. Social History  Substance Use Topics  . Smoking status: Current Every Day Smoker -- 1.00 packs/day    Types: Cigarettes  . Smokeless tobacco: None  . Alcohol Use: 1.2 oz/week    2 Cans of beer per week     Comment: daily   OB History    Gravida Para Term Preterm AB TAB SAB Ectopic Multiple Living   3    1          Review of Systems  Constitutional: Positive for chills. Negative for fever.  HENT: Positive for congestion, ear pain and sore throat.    Respiratory: Positive for cough.   Cardiovascular: Positive for chest pain.  Gastrointestinal: Negative for nausea and vomiting.  Musculoskeletal: Positive for myalgias (generalized).  All other systems reviewed and are negative.  Allergies  Review of patient's allergies indicates no known allergies.  Home Medications   Prior to Admission medications   Medication Sig Start Date End Date Taking? Authorizing Provider  HYDROcodone-acetaminophen (NORCO/VICODIN) 5-325 MG per tablet Take 1 tablet by mouth every 6 (six) hours as needed for moderate pain or severe pain. 06/25/14   Marissa Sciacca, PA-C  ibuprofen (ADVIL,MOTRIN) 200 MG tablet Take 600 mg by mouth every 6 (six) hours as needed for moderate pain.    Historical Provider, MD  penicillin v potassium (VEETID) 250 MG tablet Take 2 tablets (500 mg total) by mouth 4 (four) times daily. 10/17/15 10/24/15  Samantha Tripp Dowless, PA-C   BP 110/90 mmHg  Pulse 110  Temp(Src) 98.9 F (37.2 C) (Oral)  Resp 20  SpO2 96% Physical Exam  Constitutional: She is oriented to person, place, and time. She appears well-developed and well-nourished. No distress.  HENT:  Head: Normocephalic and atraumatic.  Right Ear: Tympanic membrane, external ear and ear canal normal. No mastoid tenderness.  Left Ear: Tympanic membrane, external ear and ear canal normal. No mastoid tenderness.  Mouth/Throat: Uvula is midline, oropharynx is clear  and moist and mucous membranes are normal. She does not have dentures. No oral lesions. No trismus in the jaw. Abnormal dentition. Dental caries present. No uvula swelling or lacerations.    Eyes: Conjunctivae are normal. Pupils are equal, round, and reactive to light. Right eye exhibits no discharge. Left eye exhibits no discharge. No scleral icterus.  Neck: Normal range of motion. Neck supple.  Cardiovascular: Normal rate.   Pulmonary/Chest: Effort normal. She has wheezes ( bilateral expiratory wheezes in lung bases). She  has no rales. She exhibits tenderness ( anterior chest wall TTP).  Lymphadenopathy:    She has no cervical adenopathy.  Neurological: She is alert and oriented to person, place, and time. Coordination normal.  Skin: Skin is warm and dry. No rash noted. She is not diaphoretic. No erythema. No pallor.  Psychiatric: She has a normal mood and affect. Her behavior is normal.  Nursing note and vitals reviewed.   ED Course  Procedures  DIAGNOSTIC STUDIES:  Oxygen Saturation is 96% on RA, normal by my interpretation.    COORDINATION OF CARE:  4:19 PM Will order CXR. Discussed treatment plan with pt at bedside and pt agreed to plan.  Imaging Review Dg Chest 2 View  10/17/2015  CLINICAL DATA:  Cough and wheezing with fever for 2 days EXAM: CHEST  2 VIEW COMPARISON:  June 17, 2013 FINDINGS: There is no edema or consolidation. The heart size and pulmonary vascularity are normal. No adenopathy. There is slight thoracolumbar levoscoliosis. IMPRESSION: No edema or consolidation. Electronically Signed   By: Bretta Bang III M.D.   On: 10/17/2015 17:21   I have personally reviewed and evaluated these images as part of my medical decision-making.   MDM   Final diagnoses:  Dental infection  Viral URI with cough    Pt presents with 1 day hx of generalized body aches, subjective fevers, and cough. Mild bilateral expiratory wheezes heard in lung bases. Pt is a smoker. Afebrile, appears well, non-toxic, non-septic. Afebrile. Pt given duoneb with significant symptomatic improvement. Ibuprofen given in triage also provided significant relief to pts body aches. CXR negative for infiltrate. Pt symptoms c/w URI, likely viral etiology. Pt will be discharged with symptomatic treatment.  Discussed return precautions.  Pt is hemodynamically stable & in NAD prior to discharge.  Patient also presents with dentalgia and very poor dentition. No abscess requiring immediate incision and drainage.  Exam not  concerning for Ludwig's angina or pharyngeal abscess. Will treat with PCN. Pt instructed to follow-up with dentist.  Discussed return precautions. Pt safe for discharge.   I personally performed the services described in this documentation, which was scribed in my presence. The recorded information has been reviewed and is accurate.     Lester Kinsman [place], PA-C 10/18/15 1016  Lorre Nick, MD 10/19/15 [phone]

## 2015-10-17 NOTE — ED Notes (Signed)
Pt c/o cough, body aches, nasal congestion since last night.  Pt also having dental pain.

## 2015-10-25 ENCOUNTER — Telehealth (HOSPITAL_COMMUNITY): Payer: Self-pay | Admitting: *Deleted

## 2015-10-25 NOTE — Telephone Encounter (Signed)
Telephoned patient at home # and can not accept calls at this time.

## 2015-11-07 ENCOUNTER — Telehealth (HOSPITAL_COMMUNITY): Payer: Self-pay | Admitting: *Deleted

## 2015-11-07 NOTE — Telephone Encounter (Signed)
Telephoned patient at home # and left message to return call to [facility] 

## 2015-12-20 ENCOUNTER — Telehealth (HOSPITAL_COMMUNITY): Payer: Self-pay | Admitting: *Deleted

## 2015-12-20 NOTE — Telephone Encounter (Signed)
Telephoned patient at home # and left message to return call to [facility] 

## 2016-01-02 ENCOUNTER — Telehealth (HOSPITAL_COMMUNITY): Payer: Self-pay | Admitting: *Deleted

## 2016-01-02 NOTE — Telephone Encounter (Signed)
Telephoned patient at home # and left message to return call to [facility] 

## 2016-03-18 ENCOUNTER — Telehealth (HOSPITAL_COMMUNITY): Payer: Self-pay | Admitting: *Deleted

## 2016-03-18 NOTE — Telephone Encounter (Signed)
Telephoned patient at home # and left message to return call to [facility] 

## 2016-03-27 ENCOUNTER — Other Ambulatory Visit: Payer: Self-pay | Admitting: Obstetrics and Gynecology

## 2016-03-27 DIAGNOSIS — Z1231 Encounter for screening mammogram for malignant neoplasm of breast: Secondary | ICD-10-CM

## 2016-04-26 ENCOUNTER — Ambulatory Visit (HOSPITAL_COMMUNITY): Payer: No Typology Code available for payment source | Attending: *Deleted

## 2016-05-08 ENCOUNTER — Telehealth (HOSPITAL_COMMUNITY): Payer: Self-pay | Admitting: *Deleted

## 2016-05-08 NOTE — Telephone Encounter (Signed)
Telephoned patient's home number and can not be completed as dialed. Tried home number several times would not go through.

## 2016-09-05 ENCOUNTER — Ambulatory Visit (HOSPITAL_COMMUNITY): Payer: Self-pay

## 2017-10-27 ENCOUNTER — Other Ambulatory Visit: Payer: Self-pay | Admitting: Obstetrics and Gynecology

## 2017-10-27 DIAGNOSIS — Z1231 Encounter for screening mammogram for malignant neoplasm of breast: Secondary | ICD-10-CM

## 2017-11-13 ENCOUNTER — Ambulatory Visit (HOSPITAL_COMMUNITY): Payer: Self-pay

## 2018-07-02 ENCOUNTER — Ambulatory Visit (HOSPITAL_COMMUNITY): Payer: Self-pay

## 2018-07-22 ENCOUNTER — Telehealth (HOSPITAL_COMMUNITY): Payer: Self-pay | Admitting: Obstetrics and Gynecology

## 2018-07-22 NOTE — Telephone Encounter (Signed)
Called and left message on patient's answering machine regarding rescheduling screening mammogram.

## 2018-07-31 ENCOUNTER — Telehealth (HOSPITAL_COMMUNITY): Payer: Self-pay | Admitting: Obstetrics and Gynecology

## 2018-07-31 NOTE — Telephone Encounter (Signed)
Called patient and left message regarding re-scheduling screening mammogram.  °

## 2018-09-30 ENCOUNTER — Emergency Department (HOSPITAL_COMMUNITY): Payer: Self-pay

## 2018-09-30 ENCOUNTER — Emergency Department (HOSPITAL_COMMUNITY)
Admission: EM | Admit: 2018-09-30 | Discharge: 2018-09-30 | Disposition: A | Discharge status: Home / Self Care | Payer: Self-pay | Attending: Emergency Medicine | Admitting: Emergency Medicine

## 2018-09-30 ENCOUNTER — Encounter (HOSPITAL_COMMUNITY): Payer: Self-pay | Admitting: Emergency Medicine

## 2018-09-30 ENCOUNTER — Other Ambulatory Visit: Payer: Self-pay

## 2018-09-30 DIAGNOSIS — Z79899 Other long term (current) drug therapy: Secondary | ICD-10-CM | POA: Insufficient documentation

## 2018-09-30 DIAGNOSIS — J0100 Acute maxillary sinusitis, unspecified: Secondary | ICD-10-CM

## 2018-09-30 DIAGNOSIS — J40 Bronchitis, not specified as acute or chronic: Secondary | ICD-10-CM

## 2018-09-30 DIAGNOSIS — Z590 Homelessness: Secondary | ICD-10-CM | POA: Insufficient documentation

## 2018-09-30 MED ORDER — AMOXICILLIN-POT CLAVULANATE 875-125 MG PO TABS: 1.0000 | ORAL_TABLET | Freq: Two times a day (BID) | ORAL | 0 refills | Status: DC

## 2018-09-30 MED ORDER — AMOXICILLIN-POT CLAVULANATE 875-125 MG PO TABS
1.0000 | ORAL_TABLET | Freq: Once | ORAL | Status: AC
  Administered 2018-09-30: 1 via ORAL
  Filled 2018-09-30: qty 1

## 2018-09-30 MED ORDER — DEXAMETHASONE SODIUM PHOSPHATE 10 MG/ML IJ SOLN
10.0000 mg | Freq: Once | INTRAMUSCULAR | Status: AC
  Administered 2018-09-30: 10 mg via INTRAMUSCULAR
  Filled 2018-09-30: qty 1

## 2018-09-30 MED ORDER — ALBUTEROL SULFATE HFA 108 (90 BASE) MCG/ACT IN AERS
1.0000 | INHALATION_SPRAY | Freq: Once | RESPIRATORY_TRACT | Status: AC
  Administered 2018-09-30: 1 via RESPIRATORY_TRACT
  Filled 2018-09-30: qty 6.7

## 2018-09-30 NOTE — Discharge Instructions (Signed)
Take Augmentin (antibiotic) twice a day for 5 day Use inhaler as needed for wheezing, coughing, shortness of breath Use Flonase (over the counter nasal spray) for nasal congestion Return if you are not improving

## 2018-09-30 NOTE — ED Provider Notes (Signed)
[facility]-EMERGENCY DEPT Provider Note   CSN: [id number] Arrival date & time: 09/30/18  1300     History   Chief Complaint Chief Complaint  Patient presents with  . chest wall pain  . Cough  . flu symptoms    HPI Lori Houston is a 58 y.o. female who presents with chest pain, cough, congestion.  Past medical history significant for tobacco abuse.  She is currently homeless.  She states that she has had congestion in her face and her chest for several weeks.  This morning she woke up with right-sided chest pain which radiates to her back.  It feels like a constant ache.  She is coughing up yellow phlegm and has associated wheezing.  She has been taking Mucinex and Alka-Seltzer without significant relief.  She also has right upper dental pain and is going to see a dentist this week.  She has been using hydrogen peroxide for that.  She has pressure in her face and a headache.  She denies fever but feels hot and has chills and sweats.  She denies shortness of breath, abdominal pain, nausea or vomiting.  HPI  Past Medical History:  Diagnosis Date  . Bruxism     Patient Active Problem List   Diagnosis Date Noted  . 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 TUBAL LIGATION    . WRIST RECONSTRUCTION       OB History    Gravida  3   Para      Term      Preterm      AB  1   Living        SAB      TAB      Ectopic      Multiple      Live Births               Home Medications    Prior to Admission medications   Medication Sig Start Date End Date Taking? Authorizing Provider  HYDROcodone-acetaminophen (NORCO/VICODIN) 5-325 MG per tablet Take 1 tablet by mouth every 6 (six) hours as needed for moderate pain or severe pain. 06/25/14   Sciacca, Marissa, PA-C  ibuprofen (ADVIL,MOTRIN) 200 MG tablet Take 600 mg by mouth every 6 (six) hours as needed for moderate pain.    [provider]    Family History No family history on file.  Social History Social History   Tobacco Use  . Smoking status: Current Every Day Smoker    Packs/day: 1.00    Types: Cigarettes  Substance Use Topics  . Alcohol use: Yes    Alcohol/week: 2.0 standard drinks    Types: 2 Cans of beer per week    Comment: daily  . Drug use: Yes    Types: Cocaine, Marijuana     Allergies   Patient has no known allergies.   Review of Systems Review of Systems  Constitutional: Positive for chills, diaphoresis and fever (Subjective).  HENT: Positive for congestion, dental problem and sinus pain. Negative for ear pain and sore throat.   Respiratory: Positive for cough and wheezing. Negative for shortness of breath.   Cardiovascular: Positive for chest pain. Negative for palpitations and leg swelling.  Gastrointestinal: Negative for abdominal pain.     Physical Exam Updated Vital Signs BP 116/83   Pulse 75   Temp 98.7 F (37.1 C) (Oral)   Resp 16   SpO2  97%   Physical Exam Vitals signs and nursing note reviewed.  Constitutional:      General: She is in acute distress (Crying).     Appearance: Normal appearance. She is well-developed.  HENT:     Head: Normocephalic and atraumatic.     Mouth/Throat:     Comments: Poor dentition. No obvious dental abscess or infection Eyes:     General: No scleral icterus.       Right eye: No discharge.        Left eye: No discharge.     Conjunctiva/sclera: Conjunctivae normal.     Pupils: Pupils are equal, round, and reactive to light.  Neck:     Musculoskeletal: Normal range of motion.  Cardiovascular:     Rate and Rhythm: Normal rate and regular rhythm.  Pulmonary:     Effort: Pulmonary effort is normal. No respiratory distress.     Breath sounds: Normal breath sounds.  Chest:     Chest wall: Tenderness (right upper) present.  Abdominal:     General: There is no distension.     Palpations: Abdomen is soft.     Tenderness: There is no  abdominal tenderness.  Musculoskeletal:     Comments: Right upper back tenderness  Skin:    General: Skin is warm and dry.  Neurological:     Mental Status: She is alert and oriented to person, place, and time.  Psychiatric:        Behavior: Behavior normal.      ED Treatments / Results  Labs (all labs ordered are listed, but only abnormal results are displayed) Labs Reviewed - No data to display  EKG None  Radiology Dg Chest 2 View  Result Date: 09/30/2018 CLINICAL DATA:  Cough and chest pain EXAM: CHEST - 2 VIEW COMPARISON:  October 17, 2015 FINDINGS: There is no appreciable edema or consolidation. The heart size and pulmonary vascularity are normal. No adenopathy. There is slight thoracolumbar dextroscoliosis. IMPRESSION: No edema or consolidation. Electronically Signed   By: Bretta BangWilliam  Woodruff III M.D.   On: 09/30/2018 14:20    Procedures Procedures (including critical care time)  Medications Ordered in ED Medications  dexamethasone (DECADRON) injection 10 mg (10 mg Intramuscular Given 09/30/18 1602)  albuterol (PROVENTIL HFA;VENTOLIN HFA) 108 (90 Base) MCG/ACT inhaler 1-2 puff (1 puff Inhalation Given 09/30/18 1602)  amoxicillin-clavulanate (AUGMENTIN) 875-125 MG per tablet 1 tablet (1 tablet Oral Given 09/30/18 1602)     Initial Impression / Assessment and Plan / ED Course  I have reviewed the triage vital signs and the nursing notes.  Pertinent labs & imaging results that were available during my care of the patient were reviewed by me and considered in my medical decision making (see chart for details).  58 year old female presents with cough, congestion, chest pain, headache for the past several weeks.  Her vital signs are normal.  On exam she has reproducible tenderness over the right chest and right upper back.  EKG is sinus rhythm.  Chest x-ray is negative for underlying pneumonia or effusion.  We will treat for sinusitis/bronchitis.  She is currently homeless  therefore she was given an inhaler here and a shot of Decadron.  She was given a prescription for Augmentin along with a coupon for good Rx and advised to take Flonase which is over-the-counter.  She was given strict return precautions for worsening symptoms.  Final Clinical Impressions(s) / ED Diagnoses   Final diagnoses:  Acute maxillary sinusitis, recurrence not specified  Bronchitis  ED Discharge Orders    None       Beryle Quant 09/30/18 1926    Raeford Razor, MD 10/02/18 [phone]

## 2018-09-30 NOTE — ED Triage Notes (Signed)
Per EMS- Patient c/o chest wall pain when she coughs. Patient c/o a cough x 2 weeks. Lungs clear.

## 2019-02-14 ENCOUNTER — Other Ambulatory Visit: Payer: Self-pay

## 2019-02-14 ENCOUNTER — Encounter (HOSPITAL_COMMUNITY): Payer: Self-pay

## 2019-02-14 ENCOUNTER — Emergency Department (HOSPITAL_COMMUNITY): Payer: Self-pay

## 2019-02-14 ENCOUNTER — Emergency Department (HOSPITAL_COMMUNITY)
Admission: EM | Admit: 2019-02-14 | Discharge: 2019-02-14 | Disposition: A | Discharge status: Home / Self Care | Payer: Self-pay | Attending: Emergency Medicine | Admitting: Emergency Medicine

## 2019-02-14 DIAGNOSIS — F17210 Nicotine dependence, cigarettes, uncomplicated: Secondary | ICD-10-CM | POA: Insufficient documentation

## 2019-02-14 DIAGNOSIS — W228XXA Striking against or struck by other objects, initial encounter: Secondary | ICD-10-CM | POA: Insufficient documentation

## 2019-02-14 DIAGNOSIS — Y929 Unspecified place or not applicable: Secondary | ICD-10-CM | POA: Insufficient documentation

## 2019-02-14 DIAGNOSIS — Y999 Unspecified external cause status: Secondary | ICD-10-CM | POA: Insufficient documentation

## 2019-02-14 DIAGNOSIS — Z79899 Other long term (current) drug therapy: Secondary | ICD-10-CM | POA: Insufficient documentation

## 2019-02-14 DIAGNOSIS — Y9389 Activity, other specified: Secondary | ICD-10-CM | POA: Insufficient documentation

## 2019-02-14 DIAGNOSIS — M79645 Pain in left finger(s): Secondary | ICD-10-CM | POA: Insufficient documentation

## 2019-02-14 MED ORDER — ACETAMINOPHEN 325 MG PO TABS
650.0000 mg | ORAL_TABLET | Freq: Once | ORAL | Status: AC
  Administered 2019-02-14: 650 mg via ORAL
  Filled 2019-02-14: qty 2

## 2019-02-14 NOTE — ED Notes (Signed)
ED Provider at bedside. 

## 2019-02-14 NOTE — Discharge Instructions (Addendum)
You were seen in the ED today for thumb pain; your xray was negative today for a break; please use thumb spica as needed until swelling reduces; you can take Ibuprofen 600 mg every 8 hours and Tylenol 650 mg every 8 hours as needed for pain (Take Ibuprofen and then 4 hours later take Tylenol). You may also rest, ice, and elevate the thumb to reduce swelling. Please follow up with PCP; if you do not have one you can follow up with [facility].

## 2019-02-14 NOTE — ED Triage Notes (Signed)
Pt states that she jammed her left thumb yesterday. Pt states that she iced it yesterday and it is still painful today.

## 2019-02-14 NOTE — ED Notes (Signed)
Patient given sandwich and water

## 2019-02-14 NOTE — ED Provider Notes (Signed)
[facility] DEPT Provider Note   CSN: [id number] Arrival date & time: 02/14/19  1025    History   Chief Complaint Chief Complaint  Patient presents with  . Hand Pain    HPI Lori Houston is a 58 y.o. female who presents to the ED complaining of sudden onset, constant, aching, 10/10, left thumb pain that began yesterday. Pt states she was "play fighting" with her friend yesterday when she jammed her finger causing immediate pain. She has placed ice on it without relief; has not taken anything for her symptoms. Denies weakness, numbness, or any other associated symptoms.        Past Medical History:  Diagnosis Date  . Bruxism     Patient Active Problem List   Diagnosis Date Noted  . 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 TUBAL LIGATION    . WRIST RECONSTRUCTION       OB History    Gravida  3   Para      Term      Preterm      AB  1   Living        SAB      TAB      Ectopic      Multiple      Live Births               Home Medications    Prior to Admission medications   Medication Sig Start Date End Date Taking? Authorizing Provider  amoxicillin-clavulanate (AUGMENTIN) 875-125 MG tablet Take 1 tablet by mouth every 12 (twelve) hours. 09/30/18   Recardo Evangelist, PA-C  HYDROcodone-acetaminophen (NORCO/VICODIN) 5-325 MG per tablet Take 1 tablet by mouth every 6 (six) hours as needed for moderate pain or severe pain. 06/25/14   Sciacca, Marissa, PA-C  ibuprofen (ADVIL,MOTRIN) 200 MG tablet Take 600 mg by mouth every 6 (six) hours as needed for moderate pain.    [provider]    Family History No family history on file.  Social History Social History   Tobacco Use  . Smoking status: Current Every Day Smoker    Packs/day: 1.00    Types: Cigarettes  Substance Use Topics  . Alcohol use: Yes    Alcohol/week: 2.0 standard drinks    Types: 2 Cans of  beer per week    Comment: daily  . Drug use: Yes    Types: Cocaine, Marijuana     Allergies   Patient has no known allergies.   Review of Systems Review of Systems  Constitutional: Negative for fever.  Musculoskeletal: Positive for arthralgias and joint swelling.  Skin: Negative for wound.  Neurological: Negative for weakness and numbness.     Physical Exam Updated Vital Signs BP 125/88 (BP Location: Right Arm)   Pulse 81   Temp 98.2 F (36.8 C) (Oral)   Resp 16   Ht 5\' 7"  (1.702 m)   Wt 61.2 kg   SpO2 96%   BMI 21.14 kg/m   Physical Exam Vitals signs and nursing note reviewed.  Constitutional:      Appearance: She is not ill-appearing.  HENT:     Head: Normocephalic and atraumatic.  Eyes:     Conjunctiva/sclera: Conjunctivae normal.  Cardiovascular:     Rate and Rhythm: Normal rate and regular rhythm.  Pulmonary:     Effort: Pulmonary effort is normal.     Breath sounds: Normal breath sounds.  Musculoskeletal:     Comments: Obvious swelling to left 1st digit MCP joint with tenderness to palpation; ROM limited due to swelling and pain; thumb opposition intact; cap refill <2seconds; strength and sensation intact throughout; 2+ radial pulse  Skin:    General: Skin is warm and dry.     Coloration: Skin is not jaundiced.  Neurological:     Mental Status: She is alert.      ED Treatments / Results  Labs (all labs ordered are listed, but only abnormal results are displayed) Labs Reviewed - No data to display  EKG None  Radiology Dg Finger Thumb Left  Result Date: 02/14/2019 CLINICAL DATA:  Jammed left thumb. EXAM: LEFT THUMB 2+V COMPARISON:  Wrist 06/12/2012 FINDINGS: Negative for fracture or dislocation involving the left thumb. Alignment is within normal limits. Partial visualization of the surgical hardware in the distal ulna. IMPRESSION: No acute abnormality to the left thumb. Electronically Signed   By: Richarda OverlieAdam  Henn M.D.   On: 02/14/2019 11:41     Procedures Procedures (including critical care time)  Medications Ordered in ED Medications  acetaminophen (TYLENOL) tablet 650 mg (650 mg Oral Given 02/14/19 1233)     Initial Impression / Assessment and Plan / ED Course  I have reviewed the triage vital signs and the nursing notes.  Pertinent labs & imaging results that were available during my care of the patient were reviewed by me and considered in my medical decision making (see chart for details).    Pt is a 58 year old female who presents to the ED complaining of left thumb pain x 1 day after jamming it; has not taken anything for the pain. Sitting comfortably in bed upon evaluation; requesting sandwich and something to drink. Xrays obtained in triage; no fracture to thumb; most like soft tissue injury; no tenderness to anatomical snuff box; will give thumb spica and tylenol for pain prior to discharge. Pt advised to take Ibuprofen and Tylenol as needed for pain and to apply RICE therapy.        Final Clinical Impressions(s) / ED Diagnoses   Final diagnoses:  Thumb pain, left    ED Discharge Orders    None       Tanda RockersVenter, Dannel Rafter, PA-C 02/14/19 1626    Sabas SousBero, Michael M, MD 02/17/19 [phone]

## 2019-08-22 ENCOUNTER — Emergency Department (HOSPITAL_COMMUNITY)
Admission: EM | Admit: 2019-08-22 | Discharge: 2019-08-22 | Disposition: A | Discharge status: Home / Self Care | Payer: Self-pay | Attending: Emergency Medicine | Admitting: Emergency Medicine

## 2019-08-22 ENCOUNTER — Emergency Department (HOSPITAL_COMMUNITY): Payer: Self-pay

## 2019-08-22 ENCOUNTER — Other Ambulatory Visit: Payer: Self-pay

## 2019-08-22 ENCOUNTER — Encounter (HOSPITAL_COMMUNITY): Payer: Self-pay | Admitting: Emergency Medicine

## 2019-08-22 DIAGNOSIS — F17210 Nicotine dependence, cigarettes, uncomplicated: Secondary | ICD-10-CM | POA: Insufficient documentation

## 2019-08-22 DIAGNOSIS — J209 Acute bronchitis, unspecified: Secondary | ICD-10-CM | POA: Insufficient documentation

## 2019-08-22 DIAGNOSIS — Z20828 Contact with and (suspected) exposure to other viral communicable diseases: Secondary | ICD-10-CM | POA: Insufficient documentation

## 2019-08-22 MED ORDER — ALBUTEROL SULFATE HFA 108 (90 BASE) MCG/ACT IN AERS
2.0000 | INHALATION_SPRAY | Freq: Once | RESPIRATORY_TRACT | Status: AC
  Administered 2019-08-22: 2 via RESPIRATORY_TRACT
  Filled 2019-08-22: qty 6.7

## 2019-08-22 MED ORDER — AZITHROMYCIN 250 MG PO TABS: 250.0000 mg | ORAL_TABLET | Freq: Every day | ORAL | 0 refills | Status: DC

## 2019-08-22 MED ORDER — BENZONATATE 100 MG PO CAPS: 100.0000 mg | ORAL_CAPSULE | Freq: Three times a day (TID) | ORAL | 0 refills | Status: DC

## 2019-08-22 NOTE — ED Triage Notes (Signed)
Pt reports having an episode of coughing and it was blood tinged.

## 2019-08-22 NOTE — ED Provider Notes (Signed)
[facility]-EMERGENCY DEPT Provider Note   CSN: [id number] Arrival date & time: 08/22/19  0107     History Chief Complaint  Patient presents with  . Hemoptysis    Lori Houston is a 59 y.o. female.  58 yo F with a chief complaints of hemoptysis.  This been going on for the past day.  The patient has had a mild cough for about 3 to 4 days.  No significant shortness of breath.  Has felt a little tight in the chest.  Not exertional.  Worse with coughing.  Continues to smoke.  Has been drinking.  She states that when she coughs really hard she comes up with some blood.  The history is provided by the patient.  Illness Severity:  Mild Onset quality:  Gradual Duration:  1 day Timing:  Constant Progression:  Worsening Chronicity:  New Associated symptoms: cough   Associated symptoms: no chest pain, no congestion, no fever, no headaches, no myalgias, no nausea, no rhinorrhea, no shortness of breath, no vomiting and no wheezing        Past Medical History:  Diagnosis Date  . Bruxism     Patient Active Problem List   Diagnosis Date Noted  . 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 TUBAL LIGATION    . WRIST RECONSTRUCTION       OB History    Gravida  3   Para      Term      Preterm      AB  1   Living        SAB      TAB      Ectopic      Multiple      Live Births              History reviewed. No pertinent family history.  Social History   Tobacco Use  . Smoking status: Current Every Day Smoker    Packs/day: 1.00    Types: Cigarettes  . Smokeless tobacco: Never Used  Substance Use Topics  . Alcohol use: Yes    Alcohol/week: 25.0 standard drinks    Types: 25 Cans of beer per week  . Drug use: Yes    Types: Cocaine, Marijuana    Home Medications Prior to Admission medications   Medication Sig Start Date End Date Taking? Authorizing Provider   amoxicillin-clavulanate (AUGMENTIN) 875-125 MG tablet Take 1 tablet by mouth every 12 (twelve) hours. 09/30/18   Bethel Born, PA-C  azithromycin (ZITHROMAX) 250 MG tablet Take 1 tablet (250 mg total) by mouth daily. Take first 2 tablets together, then 1 every day until finished. 08/22/19   Melene Plan, DO  benzonatate (TESSALON) 100 MG capsule Take 1 capsule (100 mg total) by mouth every 8 (eight) hours. 08/22/19   Melene Plan, DO  HYDROcodone-acetaminophen (NORCO/VICODIN) 5-325 MG per tablet Take 1 tablet by mouth every 6 (six) hours as needed for moderate pain or severe pain. 06/25/14   Sciacca, Marissa, PA-C  ibuprofen (ADVIL,MOTRIN) 200 MG tablet Take 600 mg by mouth every 6 (six) hours as needed for moderate pain.    [provider]    Allergies    Patient has no known allergies.  Review of Systems   Review of Systems  Constitutional: Negative for chills and fever.  HENT: Negative for congestion and rhinorrhea.   Eyes: Negative for redness and visual disturbance.  Respiratory: Positive  for cough. Negative for shortness of breath and wheezing.   Cardiovascular: Negative for chest pain and palpitations.  Gastrointestinal: Negative for nausea and vomiting.  Genitourinary: Negative for dysuria and urgency.  Musculoskeletal: Negative for arthralgias and myalgias.  Skin: Negative for pallor and wound.  Neurological: Negative for dizziness and headaches.    Physical Exam Updated Vital Signs BP 139/83 (BP Location: Right Arm)   Pulse 90   Temp 98.6 F (37 C) (Oral)   Resp 16   Ht 5\' 7"  (1.702 m)   Wt 61.2 kg   SpO2 98%   BMI 21.14 kg/m   Physical Exam Vitals and nursing note reviewed.  Constitutional:      General: She is not in acute distress.    Appearance: She is well-developed. She is not diaphoretic.  HENT:     Head: Normocephalic and atraumatic.     Comments: Erythema to the posterior oropharynx Eyes:     Pupils: Pupils are equal, round, and reactive to  light.  Cardiovascular:     Rate and Rhythm: Normal rate and regular rhythm.     Heart sounds: No murmur. No friction rub. No gallop.   Pulmonary:     Effort: Pulmonary effort is normal.     Breath sounds: No wheezing or rales.     Comments: Clear lung sounds. Abdominal:     General: There is no distension.     Palpations: Abdomen is soft.     Tenderness: There is no abdominal tenderness.  Musculoskeletal:        General: No tenderness.     Cervical back: Normal range of motion and neck supple.  Skin:    General: Skin is warm and dry.  Neurological:     Mental Status: She is alert and oriented to person, place, and time.  Psychiatric:        Behavior: Behavior normal.     ED Results / Procedures / Treatments   Labs (all labs ordered are listed, but only abnormal results are displayed) Labs Reviewed  NOVEL CORONAVIRUS, NAA (HOSP ORDER, SEND-OUT TO REF LAB; TAT 18-24 HRS)    EKG None  Radiology DG Chest Port 1 View  Result Date: 08/22/2019 CLINICAL DATA:  Hemoptysis EXAM: PORTABLE CHEST 1 VIEW COMPARISON:  09/30/2018 FINDINGS: Cardiac shadow is within normal limits. The lungs are well aerated bilaterally. No focal infiltrate or sizable effusion is seen. No bony abnormality is noted. IMPRESSION: No acute abnormality noted. Electronically Signed   By: Inez Catalina M.D.   On: 08/22/2019 02:22    Procedures Procedures (including critical care time)  Medications Ordered in ED Medications  albuterol (VENTOLIN HFA) 108 (90 Base) MCG/ACT inhaler 2 puff (has no administration in time range)    ED Course  I have reviewed the triage vital signs and the nursing notes.  Pertinent labs & imaging results that were available during my care of the patient were reviewed by me and considered in my medical decision making (see chart for details).    MDM Rules/Calculators/A&P     CHA2DS2/VAS Stroke Risk Points      N/A >= 2 Points: High Risk  1 - 1.99 Points: Medium Risk  0  Points: Low Risk    A final score could not be computed because of missing components.: Last  Change: N/A     This score determines the patient's risk of having a stroke if the  patient has atrial fibrillation.      This score is not  applicable to this patient. Components are not  calculated.                   58 yo F with a chief complaint of hemoptysis.  Patient is well-appearing nontoxic is clear lung sounds.  Chest x-ray viewed by me without focal infiltrate.  I did discuss with her that tuberculosis can sometimes cause hemoptysis, I think much more likely is that the patient is very forcefully coughing and that caused her to have some mild blood-tinged to her sputum.  We will do a Z-Pak.  PCP follow-up.  2:49 AM:  I have discussed the diagnosis/risks/treatment options with the patient and believe the pt to be eligible for discharge home to follow-up with PCP. We also discussed returning to the ED immediately if new or worsening sx occur. We discussed the sx which are most concerning (e.g., sudden worsening pain, fever, inability to tolerate by mouth) that necessitate immediate return. Medications administered to the patient during their visit and any new prescriptions provided to the patient are listed below.  Medications given during this visit Medications  albuterol (VENTOLIN HFA) 108 (90 Base) MCG/ACT inhaler 2 puff (has no administration in time range)     The patient appears reasonably screen and/or stabilized for discharge and I doubt any other medical condition or other [facility] requiring further screening, evaluation, or treatment in the ED at this time prior to discharge.   Final Clinical Impression(s) / ED Diagnoses Final diagnoses:  Acute bronchitis, unspecified organism    Rx / DC Orders ED Discharge Orders         Ordered    azithromycin (ZITHROMAX) 250 MG tablet  Daily     08/22/19 0243    benzonatate (TESSALON) 100 MG capsule  Every 8 hours     08/22/19 0243            Melene PlanFloyd, Clary Boulais, DO 08/22/19 [phone]

## 2019-08-22 NOTE — Discharge Instructions (Signed)
Take tylenol 2 pills 4 times a day and motrin 4 pills 3 times a day.  Drink plenty of fluids.  Return for worsening shortness of breath, headache, confusion. Follow up with your family doctor.   

## 2019-08-23 LAB — NOVEL CORONAVIRUS, NAA (HOSP ORDER, SEND-OUT TO REF LAB; TAT 18-24 HRS): SARS-CoV-2, NAA: NOT DETECTED

## 2020-04-20 ENCOUNTER — Emergency Department (HOSPITAL_COMMUNITY)
Admission: EM | Admit: 2020-04-20 | Discharge: 2020-04-20 | Disposition: A | Discharge status: Home / Self Care | Payer: Self-pay | Attending: Emergency Medicine | Admitting: Emergency Medicine

## 2020-04-20 ENCOUNTER — Encounter (HOSPITAL_COMMUNITY): Payer: Self-pay | Admitting: Emergency Medicine

## 2020-04-20 ENCOUNTER — Emergency Department (HOSPITAL_COMMUNITY): Payer: Self-pay

## 2020-04-20 ENCOUNTER — Other Ambulatory Visit: Payer: Self-pay

## 2020-04-20 DIAGNOSIS — S50312A Abrasion of left elbow, initial encounter: Secondary | ICD-10-CM | POA: Insufficient documentation

## 2020-04-20 DIAGNOSIS — Z79899 Other long term (current) drug therapy: Secondary | ICD-10-CM | POA: Insufficient documentation

## 2020-04-20 DIAGNOSIS — Y9389 Activity, other specified: Secondary | ICD-10-CM | POA: Insufficient documentation

## 2020-04-20 DIAGNOSIS — S46012A Strain of muscle(s) and tendon(s) of the rotator cuff of left shoulder, initial encounter: Secondary | ICD-10-CM | POA: Insufficient documentation

## 2020-04-20 DIAGNOSIS — T07XXXA Unspecified multiple injuries, initial encounter: Secondary | ICD-10-CM

## 2020-04-20 DIAGNOSIS — Y990 Civilian activity done for income or pay: Secondary | ICD-10-CM | POA: Insufficient documentation

## 2020-04-20 DIAGNOSIS — W01198A Fall on same level from slipping, tripping and stumbling with subsequent striking against other object, initial encounter: Secondary | ICD-10-CM | POA: Insufficient documentation

## 2020-04-20 DIAGNOSIS — S46912A Strain of unspecified muscle, fascia and tendon at shoulder and upper arm level, left arm, initial encounter: Secondary | ICD-10-CM

## 2020-04-20 DIAGNOSIS — F17210 Nicotine dependence, cigarettes, uncomplicated: Secondary | ICD-10-CM | POA: Insufficient documentation

## 2020-04-20 DIAGNOSIS — Y9289 Other specified places as the place of occurrence of the external cause: Secondary | ICD-10-CM | POA: Insufficient documentation

## 2020-04-20 DIAGNOSIS — S63502A Unspecified sprain of left wrist, initial encounter: Secondary | ICD-10-CM | POA: Insufficient documentation

## 2020-04-20 MED ORDER — CYCLOBENZAPRINE HCL 10 MG PO TABS: 10.0000 mg | ORAL_TABLET | Freq: Two times a day (BID) | ORAL | 0 refills | Status: DC | PRN

## 2020-04-20 MED ORDER — IBUPROFEN 600 MG PO TABS: 600.0000 mg | ORAL_TABLET | Freq: Four times a day (QID) | ORAL | 0 refills | Status: DC | PRN

## 2020-04-20 MED ORDER — IBUPROFEN 800 MG PO TABS
800.0000 mg | ORAL_TABLET | Freq: Once | ORAL | Status: AC
  Administered 2020-04-20: 800 mg via ORAL
  Filled 2020-04-20: qty 1

## 2020-04-20 NOTE — ED Notes (Signed)
Called ortho for brace and sling placement.

## 2020-04-20 NOTE — ED Triage Notes (Signed)
Patient states she fell. Patient is complaining of left wrist pain, left thigh pain, and left shoulder pain. Patient states this happen two hours ago.

## 2020-04-20 NOTE — ED Notes (Signed)
Ortho at bedside.

## 2020-04-20 NOTE — Discharge Instructions (Signed)
You have been evaluated for your fall.  Fortunately x-ray did not show any broken bone or dislocation.  Please wear wrist brace as needed for support.  Use sling as needed for comfort.  Take ibuprofen and muscle relaxant as prescribed.  You may follow-up with orthopedist for further care.

## 2020-04-20 NOTE — Progress Notes (Signed)
Orthopedic Tech Progress Note Patient Details:  DARLY MASSI [birth year] [id number]  Ortho Devices Type of Ortho Device: Velcro wrist forearm splint, Shoulder immobilizer Ortho Device/Splint Location: left Ortho Device/Splint Interventions: Application   Post Interventions Patient Tolerated: Well Instructions Provided: Care of device   Saul Fordyce 04/20/2020, 8:42 AM

## 2020-04-20 NOTE — ED Provider Notes (Signed)
[facility]-EMERGENCY DEPT Provider Note   CSN: [id number] Arrival date & time: 04/20/20  0626     History Chief Complaint  Patient presents with  . Fall    Lori Houston is a 59 y.o. female.  The history is provided by the patient. No language interpreter was used.  Fall      59 year old female presenting for evaluation of a fall.  Patient report last night while at work she was in the process of dumping trash bags into the trash bin.  She recall picking up a heavy trash bag and was using her momentum to swing the bag into the trash bin.  In the process she lost balance, fell to her left side straight into the ground.  She report striking her left elbow shoulder and wrist as well as hip against the ground.  She may have struck her head but denies any loss of consciousness.  She report acute onset of sharp throbbing pain most significant to her left wrist.  Pain is moderate to severe, and persistent.  She is right-hand dominant.  She denies any significant headache, confusion, nausea or vomiting.  She has had prior wrist surgery in the past.  She is concerned that her wrist is broken.  She denies any numbness.  Denies any neck pain.  No specific treatment tried.  Past Medical History:  Diagnosis Date  . Bruxism     Patient Active Problem List   Diagnosis Date Noted  . 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 TUBAL LIGATION    . WRIST RECONSTRUCTION       OB History    Gravida  3   Para      Term      Preterm      AB  1   Living        SAB      TAB      Ectopic      Multiple      Live Births              History reviewed. No pertinent family history.  Social History   Tobacco Use  . Smoking status: Current Every Day Smoker    Packs/day: 1.00    Types: Cigarettes  . Smokeless tobacco: Never Used  Substance Use Topics  . Alcohol use: Yes    Alcohol/week: 25.0 standard  drinks    Types: 25 Cans of beer per week  . Drug use: Yes    Types: Cocaine, Marijuana    Home Medications Prior to Admission medications   Medication Sig Start Date End Date Taking? Authorizing Provider  amoxicillin-clavulanate (AUGMENTIN) 875-125 MG tablet Take 1 tablet by mouth every 12 (twelve) hours. 09/30/18   Bethel Born, PA-C  azithromycin (ZITHROMAX) 250 MG tablet Take 1 tablet (250 mg total) by mouth daily. Take first 2 tablets together, then 1 every day until finished. 08/22/19   Melene Plan, DO  benzonatate (TESSALON) 100 MG capsule Take 1 capsule (100 mg total) by mouth every 8 (eight) hours. 08/22/19   Melene Plan, DO  HYDROcodone-acetaminophen (NORCO/VICODIN) 5-325 MG per tablet Take 1 tablet by mouth every 6 (six) hours as needed for moderate pain or severe pain. 06/25/14   Sciacca, Marissa, PA-C  ibuprofen (ADVIL,MOTRIN) 200 MG tablet Take 600 mg by mouth every 6 (six) hours as needed for moderate pain.    [provider]  Allergies    Patient has no known allergies.  Review of Systems   Review of Systems  All other systems reviewed and are negative.   Physical Exam Updated Vital Signs BP 126/77 (BP Location: Right Arm)   Pulse 87   Temp 97.6 F (36.4 C) (Oral)   Resp 20   Ht 5\' 7"  (1.702 m)   Wt 61.2 kg   SpO2 100%   BMI 21.14 kg/m   Physical Exam Vitals and nursing note reviewed.  Constitutional:      Appearance: She is well-developed.     Comments: Patient appears uncomfortable and appears sleepy.  HENT:     Head: Normocephalic and atraumatic.     Comments: No significant scalp tenderness or scalp injury Eyes:     Extraocular Movements: Extraocular movements intact.     Conjunctiva/sclera: Conjunctivae normal.     Pupils: Pupils are equal, round, and reactive to light.  Neck:     Comments: No cervical midline spine tenderness Cardiovascular:     Rate and Rhythm: Normal rate and regular rhythm.     Pulses: Normal pulses.      Heart sounds: Normal heart sounds.  Pulmonary:     Effort: Pulmonary effort is normal.     Breath sounds: Normal breath sounds.  Abdominal:     Palpations: Abdomen is soft.  Musculoskeletal:        General: Tenderness (Left arm: Abrasion noted to lateral deltoid of the shoulder tender to palpation.  Abrasion noted to lateral elbow tender to palpation.  Tenderness to left wrist without any obvious deformity or abrasion noted.  Radial pulse 2+.) present.     Cervical back: Normal range of motion and neck supple.  Skin:    Findings: No rash.  Neurological:     Mental Status: She is alert.     ED Results / Procedures / Treatments   Labs (all labs ordered are listed, but only abnormal results are displayed) Labs Reviewed - No data to display  EKG None  Radiology DG Elbow Complete Left  Result Date: 04/20/2020 CLINICAL DATA:  06/20/2020. Left elbow pain. EXAM: LEFT ELBOW - COMPLETE 3+ VIEW COMPARISON:  None. FINDINGS: The joint spaces are maintained. No acute elbow fracture is identified. No significant degenerative changes. No joint effusion. IMPRESSION: No acute bony findings or joint effusion. Electronically Signed   By: Larey Seat M.D.   On: 04/20/2020 07:48   DG Wrist Complete Left  Result Date: 04/20/2020 CLINICAL DATA:  06/20/2020. Left wrist pain. History of prior ulnar fracture. EXAM: LEFT WRIST - COMPLETE 3+ VIEW COMPARISON:  Radiographs from 2013. FINDINGS: Stable plate and screw fixation of the distal ulna. Stable calcifications in the interosseous membrane. No acute wrist fracture is identified. No significant degenerative changes. The metacarpal bones are intact. IMPRESSION: No acute wrist fracture. Electronically Signed   By: 2014 M.D.   On: 04/20/2020 07:49   DG Hip Unilat With Pelvis 2-3 Views Left  Result Date: 04/20/2020 CLINICAL DATA:  06/20/2020. Severe left hip pain. EXAM: DG HIP (WITH OR WITHOUT PELVIS) 2-3V LEFT COMPARISON:  None. FINDINGS: Both hips are normally  located. No acute hip fracture is identified. The pubic symphysis and SI joints are intact. No pelvic fractures or bone lesions. IMPRESSION: No acute bony findings. Electronically Signed   By: Larey Seat M.D.   On: 04/20/2020 07:47    Procedures Procedures (including critical care time)  Medications Ordered in ED Medications  ibuprofen (ADVIL) tablet 800 mg (  800 mg Oral Given 04/20/20 8657)    ED Course  I have reviewed the triage vital signs and the nursing notes.  Pertinent labs & imaging results that were available during my care of the patient were reviewed by me and considered in my medical decision making (see chart for details).    MDM Rules/Calculators/A&P                          BP 126/77 (BP Location: Right Arm)   Pulse 87   Temp 97.6 F (36.4 C) (Oral)   Resp 20   Ht 5\' 7"  (1.702 m)   Wt 61.2 kg   SpO2 100%   BMI 21.14 kg/m   Final Clinical Impression(s) / ED Diagnoses Final diagnoses:  Abrasions of multiple sites  Left wrist sprain, initial encounter  Left shoulder strain, initial encounter    Rx / DC Orders ED Discharge Orders         Ordered    ibuprofen (ADVIL) 600 MG tablet  Every 6 hours PRN     Discontinue  Reprint     04/20/20 0851    cyclobenzaprine (FLEXERIL) 10 MG tablet  2 times daily PRN     Discontinue  Reprint     04/20/20 0851         8:24 AM Patient lost balance and fell on the left side when she was attempting to throw a trash bag into the trash bin last night.  She has abrasion to her left shoulder and elbow as well as significant pain to her left wrist.  Mild tenderness to left hip.  X-ray of the hip, left elbow and left wrist without any acute abnormalities.  I suspect her pain is from the contusion of the fall.  She does not have any significant pain at the left anatomical snuffbox.  I will provide a wrist brace for her left wrist as well as a sling for support.  Will provide pain medication and outpatient follow-up with  orthopedist as needed.  Return precaution discussed.  Patient was made aware that x-ray sometimes may miss an occult fracture.  If she has persistent pain she should follow-up with orthopedist refer given, for recheck.   06/20/20, PA-C 04/20/20 06/20/20    [id number], MD 04/20/20 [phone]

## 2021-04-17 ENCOUNTER — Encounter (HOSPITAL_COMMUNITY): Payer: Self-pay | Admitting: Emergency Medicine

## 2021-04-17 ENCOUNTER — Emergency Department (HOSPITAL_COMMUNITY)
Admission: EM | Admit: 2021-04-17 | Discharge: 2021-04-18 | Disposition: A | Discharge status: Home / Self Care | Payer: No Typology Code available for payment source | Attending: Emergency Medicine | Admitting: Emergency Medicine

## 2021-04-17 DIAGNOSIS — R109 Unspecified abdominal pain: Secondary | ICD-10-CM | POA: Insufficient documentation

## 2021-04-17 DIAGNOSIS — F17210 Nicotine dependence, cigarettes, uncomplicated: Secondary | ICD-10-CM | POA: Insufficient documentation

## 2021-04-17 DIAGNOSIS — R519 Headache, unspecified: Secondary | ICD-10-CM | POA: Diagnosis present

## 2021-04-17 DIAGNOSIS — Z28310 Unvaccinated for covid-19: Secondary | ICD-10-CM | POA: Diagnosis not present

## 2021-04-17 DIAGNOSIS — U071 COVID-19: Secondary | ICD-10-CM | POA: Diagnosis not present

## 2021-04-17 LAB — CBC WITH DIFFERENTIAL/PLATELET
Abs Immature Granulocytes: 0.04 10*3/uL (ref 0.00–0.07)
Basophils Absolute: 0 10*3/uL (ref 0.0–0.1)
Basophils Relative: 1 %
Eosinophils Absolute: 0 10*3/uL (ref 0.0–0.5)
Eosinophils Relative: 0 %
HCT: 43.5 % (ref 36.0–46.0)
Hemoglobin: 15 g/dL (ref 12.0–15.0)
Immature Granulocytes: 1 %
Lymphocytes Relative: 6 %
Lymphs Abs: 0.5 10*3/uL — ABNORMAL LOW (ref 0.7–4.0)
MCH: 32.3 pg (ref 26.0–34.0)
MCHC: 34.5 g/dL (ref 30.0–36.0)
MCV: 93.5 fL (ref 80.0–100.0)
Monocytes Absolute: 1.1 10*3/uL — ABNORMAL HIGH (ref 0.1–1.0)
Monocytes Relative: 14 %
Neutro Abs: 6.1 10*3/uL (ref 1.7–7.7)
Neutrophils Relative %: 78 %
Platelets: 347 10*3/uL (ref 150–400)
RBC: 4.65 MIL/uL (ref 3.87–5.11)
RDW: 13.4 % (ref 11.5–15.5)
WBC: 7.7 10*3/uL (ref 4.0–10.5)
nRBC: 0 % (ref 0.0–0.2)

## 2021-04-17 LAB — COMPREHENSIVE METABOLIC PANEL
ALT: 20 U/L (ref 0–44)
AST: 26 U/L (ref 15–41)
Albumin: 4.3 g/dL (ref 3.5–5.0)
Alkaline Phosphatase: 70 U/L (ref 38–126)
Anion gap: 9 (ref 5–15)
BUN: 9 mg/dL (ref 6–20)
CO2: 24 mmol/L (ref 22–32)
Calcium: 8.9 mg/dL (ref 8.9–10.3)
Chloride: 105 mmol/L (ref 98–111)
Creatinine, Ser: 0.85 mg/dL (ref 0.44–1.00)
GFR, Estimated: >60 mL/min (ref >60)
Glucose, Bld: 108 mg/dL — ABNORMAL HIGH (ref 70–99)
Potassium: 3.8 mmol/L (ref 3.5–5.1)
Sodium: 138 mmol/L (ref 135–145)
Total Bilirubin: 0.5 mg/dL (ref 0.3–1.2)
Total Protein: 7.6 g/dL (ref 6.5–8.1)

## 2021-04-17 LAB — LIPASE, BLOOD: Lipase: 24 U/L (ref 11–51)

## 2021-04-17 MED ORDER — IBUPROFEN 800 MG PO TABS
800.0000 mg | ORAL_TABLET | Freq: Once | ORAL | Status: AC
  Administered 2021-04-17: 800 mg via ORAL
  Filled 2021-04-17: qty 1

## 2021-04-17 NOTE — ED Triage Notes (Signed)
Per EMS-complaining of all over pain and headache that started this am

## 2021-04-17 NOTE — ED Notes (Signed)
Lori Houston [phone] Patients sister would like a call back with an update

## 2021-04-17 NOTE — ED Provider Notes (Signed)
[facility]-EMERGENCY DEPT Provider Note   CSN: [id number] Arrival date & time: 04/17/21  1805     History Chief Complaint  Patient presents with   Generalized Body Aches    Lori Houston is a 60 y.o. female who presents with 24 hours of full body pain primarily with frontal headache and abdominal pain.  She is incompletely vaccinated against COVID.  History of low back pain and bruxism, otherwise does not carry medical diagnoses and is not any medications every day.  Denies any chest pain, trouble breathing, nausea, vomiting, or diarrhea.  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  Social History   Tobacco Use   Smoking status: Every Day    Packs/day: 1.00    Types: Cigarettes   Smokeless tobacco: Never  Substance Use Topics   Alcohol use: Yes    Alcohol/week: 25.0 standard drinks    Types: 25 Cans of beer per week   Drug use: Yes    Types: Cocaine, Marijuana    Home Medications Prior to Admission medications   Medication Sig Start Date End Date Taking? Authorizing Provider  amoxicillin-clavulanate (AUGMENTIN) 875-125 MG tablet Take 1 tablet by mouth every 12 (twelve) hours. 09/30/18   Bethel Born, PA-C  azithromycin (ZITHROMAX) 250 MG tablet Take 1 tablet (250 mg total) by mouth daily. Take first 2 tablets together, then 1 every day until finished. 08/22/19   Melene Plan, DO  benzonatate (TESSALON) 100 MG capsule Take 1 capsule (100 mg total) by mouth every 8 (eight) hours. 08/22/19   Melene Plan, DO  cyclobenzaprine  (FLEXERIL) 10 MG tablet Take 1 tablet (10 mg total) by mouth 2 (two) times daily as needed for muscle spasms. 04/20/20   Fayrene Helper, PA-C  HYDROcodone-acetaminophen (NORCO/VICODIN) 5-325 MG per tablet Take 1 tablet by mouth every 6 (six) hours as needed for moderate pain or severe pain. 06/25/14   Sciacca, Marissa, PA-C  ibuprofen (ADVIL) 600 MG tablet Take 1 tablet (600 mg total) by mouth every 6 (six) hours as needed. 04/20/20   Fayrene Helper, PA-C    Allergies    Patient has no known allergies.  Review of Systems   Review of Systems  Constitutional:  Positive for activity change, appetite change, chills and fatigue.  HENT: Negative.    Eyes:  Negative for visual disturbance.  Respiratory: Negative.  Negative for cough.   Cardiovascular: Negative.   Gastrointestinal:  Positive for abdominal pain. Negative for diarrhea, nausea and vomiting.  Genitourinary: Negative.   Musculoskeletal:  Positive for arthralgias and myalgias. Negative for neck pain.  Neurological:  Positive for weakness and headaches. Negative for dizziness and light-headedness.   Physical Exam  Updated Vital Signs BP (!) 165/96   Pulse (!) 101   Temp 99.6 F (37.6 C) (Oral)   Resp 18   SpO2 93%   Physical Exam Vitals and nursing note reviewed.  Constitutional:      Appearance: She is normal weight. She is not toxic-appearing.  HENT:     Head: Normocephalic and atraumatic.     Nose: Nose normal. No congestion.     Mouth/Throat:     Mouth: Mucous membranes are moist.     Pharynx: Oropharynx is clear. Uvula midline. No oropharyngeal exudate or posterior oropharyngeal erythema.     Tonsils: No tonsillar exudate.  Eyes:     General: Lids are normal. Vision grossly intact.        Right eye: No discharge.        Left eye: No discharge.     Extraocular Movements: Extraocular movements intact.     Conjunctiva/sclera:     Right eye: Right conjunctiva is injected.     Left eye: Left conjunctiva is injected.      Pupils: Pupils are equal, round, and reactive to light.  Neck:     Trachea: Trachea and phonation normal.  Cardiovascular:     Rate and Rhythm: Normal rate and regular rhythm.     Pulses: Normal pulses.     Heart sounds: Normal heart sounds. No murmur heard. Pulmonary:     Effort: Pulmonary effort is normal. No tachypnea, bradypnea, accessory muscle usage, prolonged expiration or respiratory distress.     Breath sounds: Normal breath sounds. No wheezing or rales.  Chest:     Chest wall: No mass, lacerations, deformity, swelling, tenderness or crepitus.  Abdominal:     General: Bowel sounds are normal. There is no distension.     Palpations: Abdomen is soft.     Tenderness: There is generalized abdominal tenderness and tenderness in the periumbilical area.  Musculoskeletal:        General: No deformity.     Cervical back: Normal range of motion and neck supple. No edema or rigidity. No pain with movement, spinous process tenderness or muscular tenderness.     Right lower leg: No edema.     Left lower leg: No edema.  Lymphadenopathy:     Cervical: No cervical adenopathy.  Skin:    General: Skin is warm and dry.     Capillary Refill: Capillary refill takes less than 2 seconds.     Findings: No rash.  Neurological:     General: No focal deficit present.     Mental Status: She is alert. Mental status is at baseline.     GCS: GCS eye subscore is 4. GCS verbal subscore is 5. GCS motor subscore is 6.     Motor: Motor function is intact.     Gait: Gait is intact.     Comments: Patient with mumbling speech, unwilling to expound upon her presentation with this provider.  She is moving all 4 extremities spontaneously without difficulty, answers on a positive to nearly every question for review of systems with exception of chest pain and trouble breathing.  Psychiatric:        Mood and Affect: Mood normal.    ED Results / Procedures / Treatments   Labs (all labs ordered are listed, but  only abnormal results are displayed) Labs Reviewed  RESP PANEL BY RT-PCR (FLU A&B, COVID) ARPGX2  COMPREHENSIVE METABOLIC PANEL  LIPASE, BLOOD  CBC WITH DIFFERENTIAL/PLATELET  URINALYSIS, ROUTINE W REFLEX MICROSCOPIC  EKG None  Radiology No results found.  Procedures Procedures   Medications Ordered in ED Medications  ibuprofen (ADVIL) tablet 800 mg (has no administration in time range)    ED Course  I have reviewed the triage vital signs and the nursing notes.  Pertinent labs & imaging results that were available during my care of the patient were reviewed by me and considered in my medical decision making (see chart for details).  Clinical Course as of 04/17/21 2212  Tue Apr 17, 2021  2127 Febrile to 100.7 degrees F when reassessed by this provider.  [RS]    Clinical Course User Index [RS] Kaylor Maiers, Idelia Salm   MDM Rules/Calculators/A&P                         60 year old female who was incompletely vaccinated for COVID-19 who presents with 24 hours of myalgias and headache as well as abdominal pain.  Different diagnosis includes limited to COVID-19, influenza A/B, sepsis, diverticulitis, acute gastroenteritis, colitis, other acute viral infection.  Mild tachycardic and borderline febrile on intake.  Febrile to my exam, 9.7 F, cardiopulmonary exam is normal, abdominal exam with generalized tenderness palpation most prominent in the left upper quadrant.  Patient is neurovascular intact upper extremities.  Given degree of tenderness palpation endorsed by patient on abdominal exam, feel laboratory studies are warranted.  We will also screen for COVID-19, which is new most suspected diagnosis at this time.  Ibuprofen offered.  Care this patient signed out to oncoming ED provider Elpidio Anis, PA-C at time of shift change.  All pertinent HPI, physical exam findings were discussed with her prior to my departure.  I appreciate her collaboration with care of this  patient.  Pending normal laboratory studies, suspect patient will be discharged home.  If COVID-positive would be eligible for antiviral therapy.  This chart was dictated using voice recognition software, Dragon. Despite the best efforts of this provider to proofread and correct errors, errors may still occur which can change documentation meaning.   EDIN SKARDA was evaluated in Emergency Department on 04/17/2021 for the symptoms described in the history of present illness. She was evaluated in the context of the global COVID-19 pandemic, which necessitated consideration that the patient might be at risk for infection with the SARS-CoV-2 virus that causes COVID-19. Institutional protocols and algorithms that pertain to the evaluation of patients at risk for COVID-19 are in a state of rapid change based on information released by regulatory bodies including the CDC and federal and state organizations. These policies and algorithms were followed during the patient's care in the ED.  Final Clinical Impression(s) / ED Diagnoses Final diagnoses:  None    Rx / DC Orders ED Discharge Orders     None        Sherrilee Gilles 04/17/21 2212    Mancel Bale, MD 04/18/21 1451

## 2021-04-17 NOTE — ED Provider Notes (Signed)
Patient care signed out at end of shift by Loleta Dicker, PA-C. Here with generalized aches including Abdom pain. No vomiting or diarrhea. No SOB. Labs pending.   Today's Vitals   04/17/21 2330 04/18/21 0000 04/18/21 0030 04/18/21 0302  BP: 117/69 114/72 124/70 (!) 141/102  Pulse: 85 75 77 82  Resp:   18 18  Temp:    98.2 F (36.8 C)  TempSrc:    Oral  SpO2: 91% 91% 94% 95%  PainSc:    8    There is no height or weight on file to calculate BMI.  Plan:  Review labs when resulted.  Ambulate with pulse ox on recheck.  Patient's labs show only that she is COVID+. This was relayed to the patient. Discussed return precautions, isolation, use of masks, will provide N95s for her to use as she is caring for a young child and there is no alternative care.   Rx: Forde Radon, PA-C 04/18/21 0310    Mancel Bale, MD 04/18/21 [phone]

## 2021-04-18 LAB — URINALYSIS, ROUTINE W REFLEX MICROSCOPIC
Bilirubin Urine: NEGATIVE
Glucose, UA: NEGATIVE mg/dL
Hgb urine dipstick: NEGATIVE
Ketones, ur: 20 mg/dL — AB
Leukocytes,Ua: NEGATIVE
Nitrite: NEGATIVE
Protein, ur: NEGATIVE mg/dL
Specific Gravity, Urine: 1.008 (ref 1.005–1.030)
pH: 6 (ref 5.0–8.0)

## 2021-04-18 LAB — RESP PANEL BY RT-PCR (FLU A&B, COVID) ARPGX2
Influenza A by PCR: NEGATIVE
Influenza B by PCR: NEGATIVE
SARS Coronavirus 2 by RT PCR: POSITIVE — AB

## 2021-04-18 MED ORDER — PAXLOVID 10 X 150 MG & 10 X 100MG PO TBPK: 2.0000 | ORAL_TABLET | Freq: Two times a day (BID) | ORAL | 0 refills | Status: AC

## 2021-04-18 NOTE — ED Notes (Signed)
Pt ambulatory in room, 02 sat=95% while ambulating.

## 2021-04-18 NOTE — ED Notes (Signed)
Pt ambulatory to restroom, tolerated well.  Upon return to room, pt declined to have blood pressure or pulse oximetry device reattached.

## 2021-04-18 NOTE — Discharge Instructions (Addendum)
Fill the Paxlovid prescription for the medication we use to treat COVID-19. It is a virus so there is no cure, but this medication will help reduce symptoms and possibly reduce the amount of time it takes to get better.

## 2021-09-22 ENCOUNTER — Encounter (HOSPITAL_COMMUNITY): Payer: Self-pay | Admitting: Emergency Medicine

## 2021-09-22 ENCOUNTER — Emergency Department (HOSPITAL_COMMUNITY): Payer: Non-veteran care

## 2021-09-22 ENCOUNTER — Emergency Department (HOSPITAL_COMMUNITY)
Admission: EM | Admit: 2021-09-22 | Discharge: 2021-09-22 | Discharge status: Against Medical Advice | Payer: Non-veteran care | Attending: Emergency Medicine | Admitting: Emergency Medicine

## 2021-09-22 DIAGNOSIS — R0789 Other chest pain: Secondary | ICD-10-CM | POA: Insufficient documentation

## 2021-09-22 DIAGNOSIS — M542 Cervicalgia: Secondary | ICD-10-CM | POA: Insufficient documentation

## 2021-09-22 DIAGNOSIS — N9489 Other specified conditions associated with female genital organs and menstrual cycle: Secondary | ICD-10-CM | POA: Diagnosis not present

## 2021-09-22 DIAGNOSIS — R059 Cough, unspecified: Secondary | ICD-10-CM | POA: Insufficient documentation

## 2021-09-22 DIAGNOSIS — Z5321 Procedure and treatment not carried out due to patient leaving prior to being seen by health care provider: Secondary | ICD-10-CM | POA: Insufficient documentation

## 2021-09-22 LAB — I-STAT BETA HCG BLOOD, ED (MC, WL, AP ONLY): I-stat hCG, quantitative: <5 m[IU]/mL (ref <5)

## 2021-09-22 LAB — BASIC METABOLIC PANEL
Anion gap: 9 (ref 5–15)
BUN: 12 mg/dL (ref 6–20)
CO2: 21 mmol/L — ABNORMAL LOW (ref 22–32)
Calcium: 8.5 mg/dL — ABNORMAL LOW (ref 8.9–10.3)
Chloride: 106 mmol/L (ref 98–111)
Creatinine, Ser: 0.69 mg/dL (ref 0.44–1.00)
GFR, Estimated: >60 mL/min (ref >60)
Glucose, Bld: 101 mg/dL — ABNORMAL HIGH (ref 70–99)
Potassium: 3.9 mmol/L (ref 3.5–5.1)
Sodium: 136 mmol/L (ref 135–145)

## 2021-09-22 LAB — CBC
HCT: 45.1 % (ref 36.0–46.0)
Hemoglobin: 15.3 g/dL — ABNORMAL HIGH (ref 12.0–15.0)
MCH: 32.6 pg (ref 26.0–34.0)
MCHC: 33.9 g/dL (ref 30.0–36.0)
MCV: 96.2 fL (ref 80.0–100.0)
Platelets: 361 10*3/uL (ref 150–400)
RBC: 4.69 MIL/uL (ref 3.87–5.11)
RDW: 13.7 % (ref 11.5–15.5)
WBC: 10.2 10*3/uL (ref 4.0–10.5)
nRBC: 0 % (ref 0.0–0.2)

## 2021-09-22 LAB — TROPONIN I (HIGH SENSITIVITY): Troponin I (High Sensitivity): 9 ng/L (ref <18)

## 2021-09-22 NOTE — ED Provider Triage Note (Signed)
Emergency Medicine Provider Triage Evaluation Note  Lori Houston , a 61 y.o. female  was evaluated in triage.  Pt complains of right-sided chest pain of about a month duration with associated radiation to right upper extremity.  She denies lightheadedness, shortness of breath, or palpitations.  She states she was evaluated at [facility] and treated for MSK pain without any improvement.  She does states she has not taken anything over-the-counter since being evaluated at that time.  She also reports left-sided neck pain when she coughs..  Review of Systems  Positive: As above Negative: As above  Physical Exam  BP (!) 134/94    Pulse 90    Temp 98.2 F (36.8 C) (Oral)    Resp 16    SpO2 99%  Gen:   Awake, no distress   Resp:  Normal effort  MSK:   Moves extremities without difficulty  Other:    Medical Decision Making  Medically screening exam initiated at 10:33 AM.  Appropriate orders placed.  AYSIA LOWDER was informed that the remainder of the evaluation will be completed by another provider, this initial triage assessment does not replace that evaluation, and the importance of remaining in the ED until their evaluation is complete.     Marita Kansas, PA-C 09/22/21 1034

## 2021-09-22 NOTE — ED Notes (Signed)
Patient called for room placement x3 with no answer. 

## 2021-09-22 NOTE — ED Notes (Signed)
Patient called for room placement x2 with no answer. 

## 2021-09-22 NOTE — ED Notes (Signed)
Patient called for room placement x1 with no answer. 

## 2021-09-22 NOTE — ED Triage Notes (Signed)
Patient c/o chest pain radiating to neck and R arm for weeks. States pain worsens with cough. Reports seen for same at [facility] and dx musculoskeletal pain.

## 2022-04-18 ENCOUNTER — Ambulatory Visit (HOSPITAL_COMMUNITY)
Admission: EM | Admit: 2022-04-18 | Discharge: 2022-04-18 | Disposition: A | Discharge status: Home / Self Care | Payer: No Typology Code available for payment source | Attending: Internal Medicine | Admitting: Internal Medicine

## 2022-04-18 ENCOUNTER — Encounter (HOSPITAL_COMMUNITY): Payer: Self-pay | Admitting: Emergency Medicine

## 2022-04-18 DIAGNOSIS — N760 Acute vaginitis: Secondary | ICD-10-CM | POA: Insufficient documentation

## 2022-04-18 LAB — POCT URINALYSIS DIPSTICK, ED / UC
Bilirubin Urine: NEGATIVE
Glucose, UA: NEGATIVE mg/dL
Ketones, ur: NEGATIVE mg/dL
Nitrite: NEGATIVE
Protein, ur: NEGATIVE mg/dL
Specific Gravity, Urine: 1.015 (ref 1.005–1.030)
Urobilinogen, UA: 0.2 mg/dL (ref 0.0–1.0)
pH: 7 (ref 5.0–8.0)

## 2022-04-18 MED ORDER — CEFTRIAXONE SODIUM 500 MG IJ SOLR
500.0000 mg | Freq: Once | INTRAMUSCULAR | Status: AC
  Administered 2022-04-18: 500 mg via INTRAMUSCULAR

## 2022-04-18 MED ORDER — CEFTRIAXONE SODIUM 500 MG IJ SOLR
INTRAMUSCULAR | Status: AC
  Filled 2022-04-18: qty 500

## 2022-04-18 NOTE — ED Triage Notes (Signed)
Pt reports a "yellow" vaginal discharge x 1 week and cloudy urine x 3 days.

## 2022-04-18 NOTE — Discharge Instructions (Addendum)
We will call you with recommendations if labs are abnormal Please abstain from sexual intercourse until lab results are available If lab results are significant and treatment for an STD is needed, you will need to abstain from sexual intercourse 7 days after you start treatment for the STD. Safe sex practices advised Return to urgent care if symptoms persist or worsens.

## 2022-04-20 NOTE — ED Provider Notes (Signed)
[facility]    CSN: [id number] Arrival date & time: 04/18/22  1037      History   Chief Complaint Chief Complaint  Patient presents with   Vaginal Discharge    HPI Lori Houston is a 61 y.o. female comes to the urgent care with yellowish vaginal discharge for 4 weeks duration.  Patient says symptoms started 3 days after she had vaginal sexual intercourse with a random sexual partner.  She engaged in unprotected sexual intercourse.  The discharge is yellowish in color and associated with some irritation in the vaginal area.  No abdominal pain, nausea or vomiting.  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 No pertinent family history.  Social History Social History   Tobacco Use   Smoking status: Every Day    Packs/day: 1.00    Types: Cigarettes   Smokeless tobacco: Never  Substance Use Topics   Alcohol use: Yes    Alcohol/week: 25.0 standard drinks of alcohol    Types: 25 Cans of beer per week   Drug use: Yes    Types: Cocaine, Marijuana     Allergies   Patient has no known allergies.   Review of Systems Review of Systems  Constitutional: Negative.   Respiratory: Negative.    Gastrointestinal: Negative.   Genitourinary:  Positive for vaginal discharge and vaginal pain. Negative for dysuria, flank pain, menstrual problem and urgency.     Physical Exam Triage Vital Signs ED Triage Vitals  Enc Vitals Group      BP 04/18/22 1054 (!) 131/91     Pulse Rate 04/18/22 1054 84     Resp 04/18/22 1054 18     Temp 04/18/22 1054 98.5 F (36.9 C)     Temp Source 04/18/22 1054 Oral     SpO2 04/18/22 1054 96 %     Weight --      Height --      Head Circumference --      Peak Flow --      Pain Score 04/18/22 1052 0     Pain Loc --      Pain Edu? --      Excl. in GC? --    No data found.  Updated Vital Signs BP (!) 131/91 (BP Location: Left Arm)   Pulse 84   Temp 98.5 F (36.9 C) (Oral)   Resp 18   SpO2 96%   Visual Acuity Right Eye Distance:   Left Eye Distance:   Bilateral Distance:    Right Eye Near:   Left Eye  Near:    Bilateral Near:     Physical Exam Vitals and nursing note reviewed.  Constitutional:      General: She is not in acute distress.    Appearance: She is not ill-appearing.  Cardiovascular:     Rate and Rhythm: Normal rate and regular rhythm.  Pulmonary:     Effort: Pulmonary effort is normal.     Breath sounds: Normal breath sounds.  Abdominal:     General: Bowel sounds are normal.     Palpations: Abdomen is soft.  Musculoskeletal:        General: Normal range of motion.  Skin:    General: Skin is warm.  Neurological:     Mental Status: She is alert.      UC Treatments / Results  Labs (all labs ordered are listed, but only abnormal results are displayed) Labs Reviewed  POCT URINALYSIS DIPSTICK, ED / UC - Abnormal; Notable for the following components:      Result Value   Hgb urine dipstick TRACE (*)    Leukocytes,Ua MODERATE (*)    All other components within normal limits  CERVICOVAGINAL ANCILLARY ONLY    EKG   Radiology No results found.  Procedures Procedures (including critical care time)  Medications Ordered in UC Medications  cefTRIAXone (ROCEPHIN) injection 500 mg (500 mg Intramuscular Given 04/18/22 1139)    Initial Impression / Assessment and Plan / UC Course  I have reviewed the triage vital signs and the nursing  notes.  Pertinent labs & imaging results that were available during my care of the patient were reviewed by me and considered in my medical decision making (see chart for details).     1.  Acute vaginitis-presumed gonorrhea: Point-of-care urinalysis is positive for hemoglobin and leukocyte Estrace Cervical vaginal swab for GC/chlamydia/trichomonas Ceftriaxone 500 mg IM x1 dose given Patient is advised to abstain from sexual intercourse until lab results are available Return precautions given We will call patient with results if significant. Final Clinical Impressions(s) / UC Diagnoses   Final diagnoses:  Acute vaginitis     Discharge Instructions      We will call you with recommendations if labs are abnormal Please abstain from sexual intercourse until lab results are available If lab results are significant and treatment for an STD is needed, you will need to abstain from sexual intercourse 7 days after you start treatment for the STD. Safe sex practices advised Return to urgent care if symptoms persist or worsens.   ED Prescriptions   None    PDMP not reviewed this encounter.   Merrilee Jansky, MD 04/20/22 [phone]

## 2022-04-22 ENCOUNTER — Telehealth (HOSPITAL_COMMUNITY): Payer: Self-pay | Admitting: Emergency Medicine

## 2022-04-22 ENCOUNTER — Encounter (HOSPITAL_COMMUNITY): Payer: Self-pay | Admitting: Emergency Medicine

## 2022-04-22 LAB — CERVICOVAGINAL ANCILLARY ONLY
Bacterial Vaginitis (gardnerella): NEGATIVE
Candida Glabrata: NEGATIVE
Candida Vaginitis: NEGATIVE
Chlamydia: NEGATIVE
Comment: NEGATIVE
Comment: NEGATIVE
Comment: NEGATIVE
Comment: NEGATIVE
Comment: NEGATIVE
Comment: NORMAL
Neisseria Gonorrhea: NEGATIVE
Trichomonas: POSITIVE — AB

## 2022-04-22 MED ORDER — METRONIDAZOLE 500 MG PO TABS: 2000.0000 mg | ORAL_TABLET | Freq: Once | ORAL | 0 refills | Status: AC

## 2022-06-22 ENCOUNTER — Other Ambulatory Visit: Payer: Self-pay

## 2022-06-22 ENCOUNTER — Emergency Department (HOSPITAL_BASED_OUTPATIENT_CLINIC_OR_DEPARTMENT_OTHER)
Admission: EM | Admit: 2022-06-22 | Discharge: 2022-06-22 | Disposition: A | Discharge status: Home / Self Care | Payer: No Typology Code available for payment source | Attending: Emergency Medicine | Admitting: Emergency Medicine

## 2022-06-22 ENCOUNTER — Emergency Department (HOSPITAL_BASED_OUTPATIENT_CLINIC_OR_DEPARTMENT_OTHER): Payer: No Typology Code available for payment source

## 2022-06-22 DIAGNOSIS — F17200 Nicotine dependence, unspecified, uncomplicated: Secondary | ICD-10-CM | POA: Diagnosis not present

## 2022-06-22 DIAGNOSIS — J449 Chronic obstructive pulmonary disease, unspecified: Secondary | ICD-10-CM | POA: Insufficient documentation

## 2022-06-22 DIAGNOSIS — U071 COVID-19: Secondary | ICD-10-CM | POA: Diagnosis not present

## 2022-06-22 DIAGNOSIS — R059 Cough, unspecified: Secondary | ICD-10-CM | POA: Diagnosis present

## 2022-06-22 DIAGNOSIS — I10 Essential (primary) hypertension: Secondary | ICD-10-CM | POA: Insufficient documentation

## 2022-06-22 LAB — COMPREHENSIVE METABOLIC PANEL
ALT: 14 U/L (ref 0–44)
AST: 22 U/L (ref 15–41)
Albumin: 4.8 g/dL (ref 3.5–5.0)
Alkaline Phosphatase: 62 U/L (ref 38–126)
Anion gap: 10 (ref 5–15)
BUN: 12 mg/dL (ref 8–23)
CO2: 26 mmol/L (ref 22–32)
Calcium: 9.9 mg/dL (ref 8.9–10.3)
Chloride: 98 mmol/L (ref 98–111)
Creatinine, Ser: 0.92 mg/dL (ref 0.44–1.00)
GFR, Estimated: >60 mL/min (ref >60)
Glucose, Bld: 102 mg/dL — ABNORMAL HIGH (ref 70–99)
Potassium: 4.6 mmol/L (ref 3.5–5.1)
Sodium: 134 mmol/L — ABNORMAL LOW (ref 135–145)
Total Bilirubin: 0.6 mg/dL (ref 0.3–1.2)
Total Protein: 8.7 g/dL — ABNORMAL HIGH (ref 6.5–8.1)

## 2022-06-22 LAB — CBC WITH DIFFERENTIAL/PLATELET
Abs Immature Granulocytes: 0.05 10*3/uL (ref 0.00–0.07)
Basophils Absolute: 0 10*3/uL (ref 0.0–0.1)
Basophils Relative: 0 %
Eosinophils Absolute: 0.1 10*3/uL (ref 0.0–0.5)
Eosinophils Relative: 1 %
HCT: 45.6 % (ref 36.0–46.0)
Hemoglobin: 15.8 g/dL — ABNORMAL HIGH (ref 12.0–15.0)
Immature Granulocytes: 0 %
Lymphocytes Relative: 4 %
Lymphs Abs: 0.4 10*3/uL — ABNORMAL LOW (ref 0.7–4.0)
MCH: 32.5 pg (ref 26.0–34.0)
MCHC: 34.6 g/dL (ref 30.0–36.0)
MCV: 93.8 fL (ref 80.0–100.0)
Monocytes Absolute: 1.1 10*3/uL — ABNORMAL HIGH (ref 0.1–1.0)
Monocytes Relative: 10 %
Neutro Abs: 9.5 10*3/uL — ABNORMAL HIGH (ref 1.7–7.7)
Neutrophils Relative %: 85 %
Platelets: 332 10*3/uL (ref 150–400)
RBC: 4.86 MIL/uL (ref 3.87–5.11)
RDW: 13.1 % (ref 11.5–15.5)
WBC: 11.1 10*3/uL — ABNORMAL HIGH (ref 4.0–10.5)
nRBC: 0 % (ref 0.0–0.2)

## 2022-06-22 LAB — URINALYSIS, ROUTINE W REFLEX MICROSCOPIC
Bilirubin Urine: NEGATIVE
Glucose, UA: NEGATIVE mg/dL
Hgb urine dipstick: NEGATIVE
Ketones, ur: NEGATIVE mg/dL
Leukocytes,Ua: NEGATIVE
Nitrite: NEGATIVE
Protein, ur: NEGATIVE mg/dL
Specific Gravity, Urine: <1.005 — ABNORMAL LOW (ref 1.005–1.030)
pH: 5.5 (ref 5.0–8.0)

## 2022-06-22 LAB — RESP PANEL BY RT-PCR (FLU A&B, COVID) ARPGX2
Influenza A by PCR: NEGATIVE
Influenza B by PCR: NEGATIVE
SARS Coronavirus 2 by RT PCR: POSITIVE — AB

## 2022-06-22 LAB — CK: Total CK: 83 U/L (ref 38–234)

## 2022-06-22 MED ORDER — AEROCHAMBER PLUS FLO-VU MEDIUM MISC
1.0000 | Freq: Once | Status: AC
  Administered 2022-06-22: 1
  Filled 2022-06-22: qty 1

## 2022-06-22 MED ORDER — ALBUTEROL SULFATE HFA 108 (90 BASE) MCG/ACT IN AERS
1.0000 | INHALATION_SPRAY | Freq: Once | RESPIRATORY_TRACT | Status: AC
  Administered 2022-06-22: 2 via RESPIRATORY_TRACT
  Filled 2022-06-22: qty 6.7

## 2022-06-22 MED ORDER — IBUPROFEN 800 MG PO TABS
800.0000 mg | ORAL_TABLET | Freq: Once | ORAL | Status: AC
Start: 2022-06-22 — End: 2022-06-22
  Administered 2022-06-22: 800 mg via ORAL
  Filled 2022-06-22: qty 1

## 2022-06-22 MED ORDER — NIRMATRELVIR/RITONAVIR (PAXLOVID)TABLET: 3.0000 | ORAL_TABLET | Freq: Two times a day (BID) | ORAL | 0 refills | Status: AC

## 2022-06-22 MED ORDER — ACETAMINOPHEN 500 MG PO TABS
1000.0000 mg | ORAL_TABLET | Freq: Once | ORAL | Status: AC
  Administered 2022-06-22: 1000 mg via ORAL
  Filled 2022-06-22: qty 2

## 2022-06-22 NOTE — ED Notes (Signed)
Pt was complaining of back pain and abdominal pain, after given tylenol and ibuprofen pt. Is calm and is asleep.

## 2022-06-22 NOTE — ED Notes (Signed)
Pt  tolerated medication and treatment well. Pt. Was discharged with instructions for return if feeling SHOB, Fever spikes high, or condition worsens.

## 2022-06-22 NOTE — ED Triage Notes (Signed)
Pt states she took covid test at home and was positive. Came here for medication.

## 2022-06-22 NOTE — ED Provider Notes (Signed)
[place] EMERGENCY DEPT Provider Note   CSN: [id number] Arrival date & time: 06/22/22  1000     History {Add pertinent medical, surgical, social history, OB history to HPI:1} No chief complaint on file.   Lori Houston is a 61 y.o. female.  HPI     Home Medications Prior to Admission medications   Not on File      Allergies    Patient has no known allergies.    Review of Systems   Review of Systems  Physical Exam Updated Vital Signs BP 135/81   Pulse 94   Temp 98.3 F (36.8 C) (Oral)   Resp 18   Ht 5\' 7"  (1.702 m)   Wt 59 kg   SpO2 98%   BMI 20.36 kg/m  Physical Exam  ED Results / Procedures / Treatments   Labs (all labs ordered are listed, but only abnormal results are displayed) Labs Reviewed  RESP PANEL BY RT-PCR (FLU A&B, COVID) ARPGX2 - Abnormal; Notable for the following components:      Result Value   SARS Coronavirus 2 by RT PCR POSITIVE (*)    All other components within normal limits  CBC WITH DIFFERENTIAL/PLATELET - Abnormal; Notable for the following components:   WBC 11.1 (*)    Hemoglobin 15.8 (*)    Neutro Abs 9.5 (*)    Lymphs Abs 0.4 (*)    Monocytes Absolute 1.1 (*)    All other components within normal limits  COMPREHENSIVE METABOLIC PANEL - Abnormal; Notable for the following components:   Sodium 134 (*)    Glucose, Bld 102 (*)    Total Protein 8.7 (*)    All other components within normal limits  URINALYSIS, ROUTINE W REFLEX MICROSCOPIC - Abnormal; Notable for the following components:   Color, Urine COLORLESS (*)    Specific Gravity, Urine <1.005 (*)    All other components within normal limits  CK    EKG None  Radiology DG Chest Portable 1 View  Result Date: 06/22/2022 CLINICAL DATA:  Lower abdominal pain.  COVID positive EXAM: PORTABLE CHEST 1 VIEW COMPARISON:  09/22/2021 FINDINGS: The heart size and mediastinal contours are within normal limits. Hyperinflated lungs with coarsened interstitial  markings bilaterally. Minimal bibasilar atelectasis. No pleural effusion or pneumothorax. The visualized skeletal structures are unremarkable. IMPRESSION: COPD with minimal bibasilar atelectasis. Otherwise no active cardiopulmonary disease. Electronically Signed   By: Davina Poke D.O.   On: 06/22/2022 14:29    Procedures Procedures  {Document cardiac monitor, telemetry assessment procedure when appropriate:1}  Medications Ordered in ED Medications  acetaminophen (TYLENOL) tablet 1,000 mg (1,000 mg Oral Given 06/22/22 1352)  ibuprofen (ADVIL) tablet 800 mg (800 mg Oral Given 06/22/22 1352)  albuterol (VENTOLIN HFA) 108 (90 Base) MCG/ACT inhaler 1-2 puff (2 puffs Inhalation Given 06/22/22 1409)  AeroChamber Plus Flo-Vu Medium MISC 1 each (1 each Other Given 06/22/22 1410)    ED Course/ Medical Decision Making/ A&P                           Medical Decision Making Amount and/or Complexity of Data Reviewed Labs: ordered. Radiology: ordered.  Risk OTC drugs. Prescription drug management.   ***  {Document critical care time when appropriate:1} {Document review of labs and clinical decision tools ie heart score, Chads2Vasc2 etc:1}  {Document your independent review of radiology images, and any outside records:1} {Document your discussion with family members, caretakers, and with consultants:1} {Document social determinants  of health affecting pt's care:1} {Document your decision making why or why not admission, treatments were needed:1} Final Clinical Impression(s) / ED Diagnoses Final diagnoses:  None    Rx / DC Orders ED Discharge Orders     None

## 2022-06-22 NOTE — Discharge Instructions (Signed)
You were seen in the ER for evaluation of your cough and cold symptoms. You tested positive for COVID. I have started you on Paxlovid. Please follow the instructions. Make sure you are quarantining and staying isolating to prevent to spread of disease. You can take the inhaler as needed for cough. If you have any concerns, new or worsening symptoms, please return to the ER for re-evlaution.    Get help right away if: You have trouble breathing. You have pain or pressure in your chest. You are confused. You have bluish lips and fingernails. You have trouble waking from sleep. You have symptoms that get worse. These symptoms may be an emergency. Get help right away. Call 911. Do not wait to see if the symptoms will go away. Do not drive yourself to the hospital.

## 2022-08-04 IMAGING — CR DG CHEST 2V
2 series · 2 of 2 positions shown · non-contrast
Comparison: 08/22/2019

CLINICAL DATA: Right-sided chest pain for 1 month.

EXAM:
CHEST - 2 VIEW

[w chest pa]
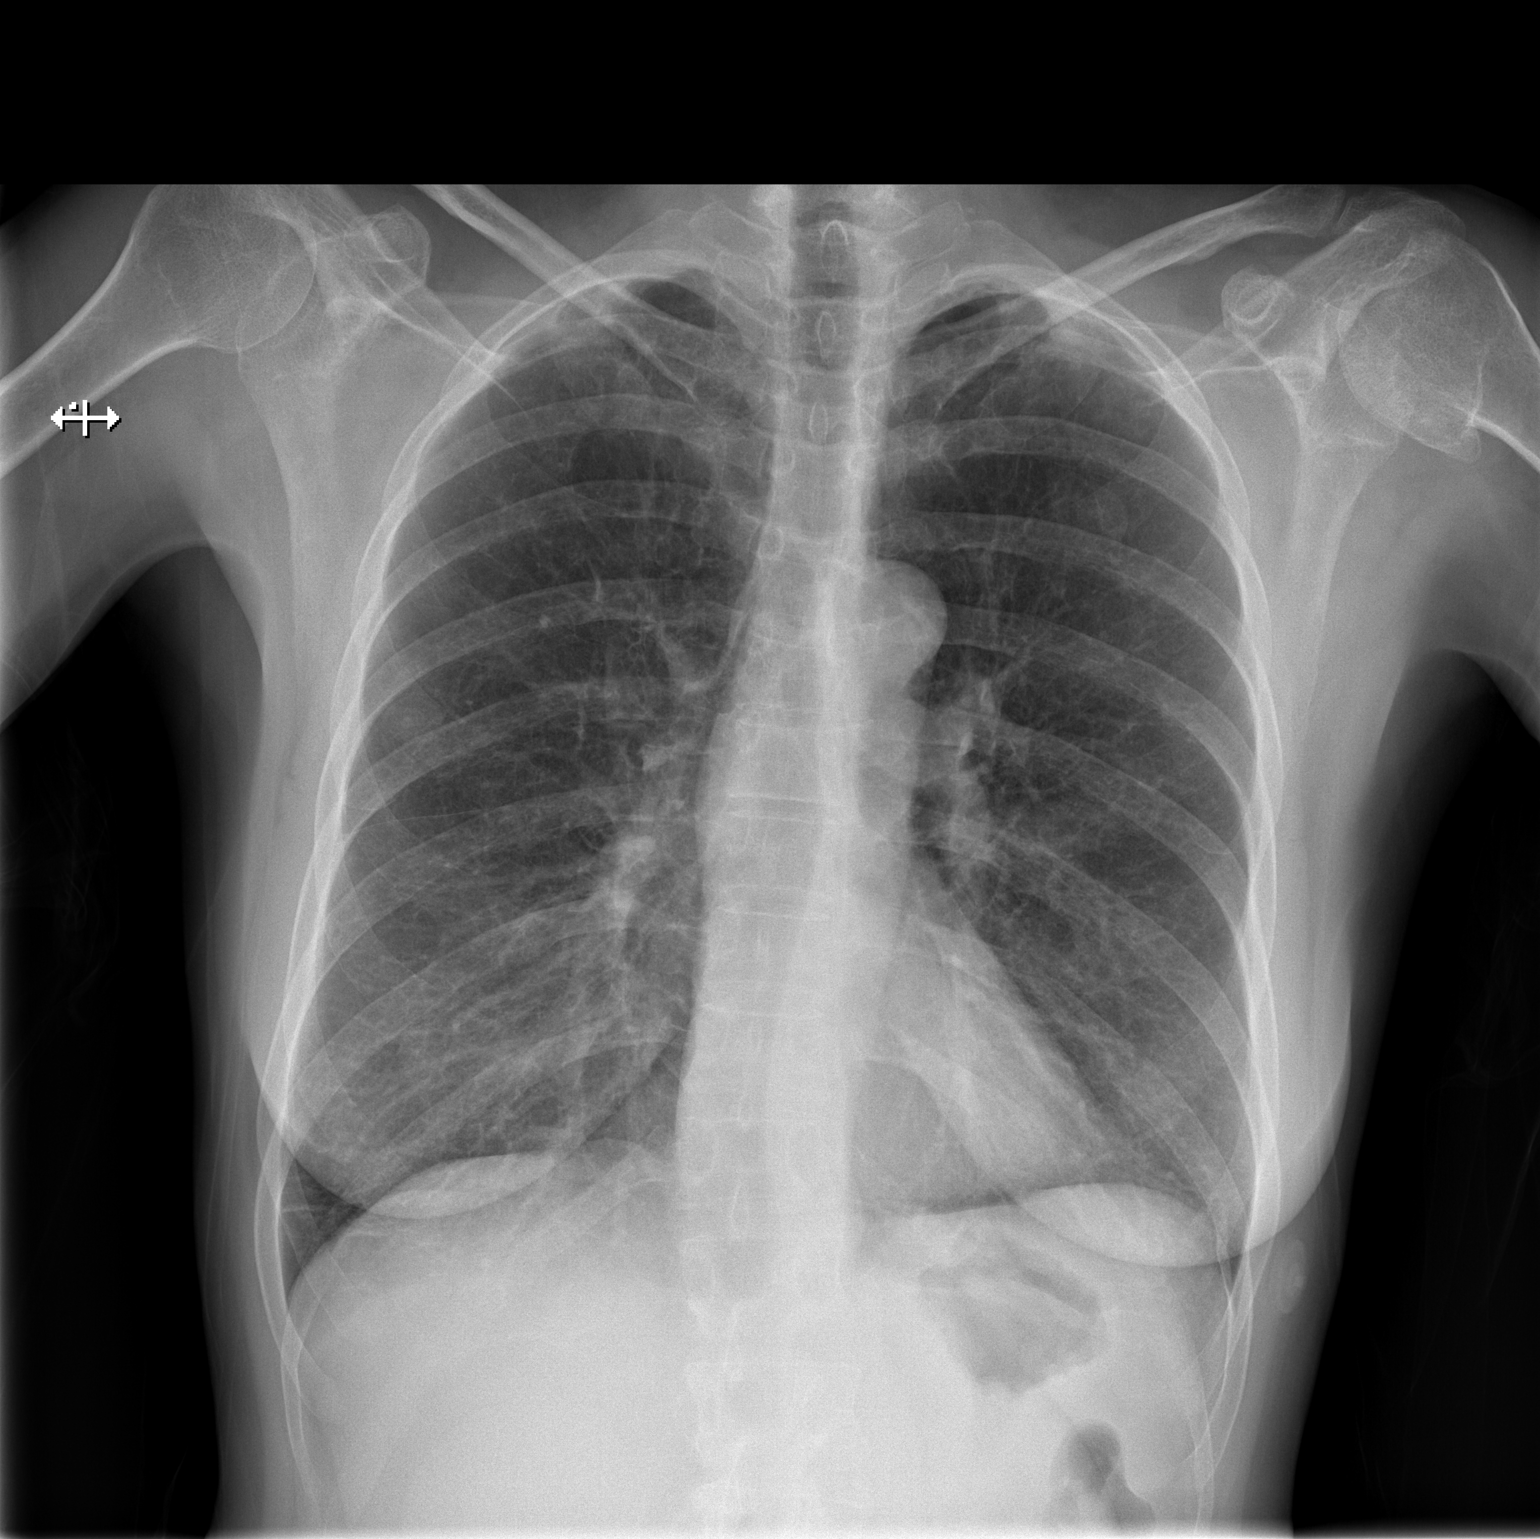

[w chest lat]
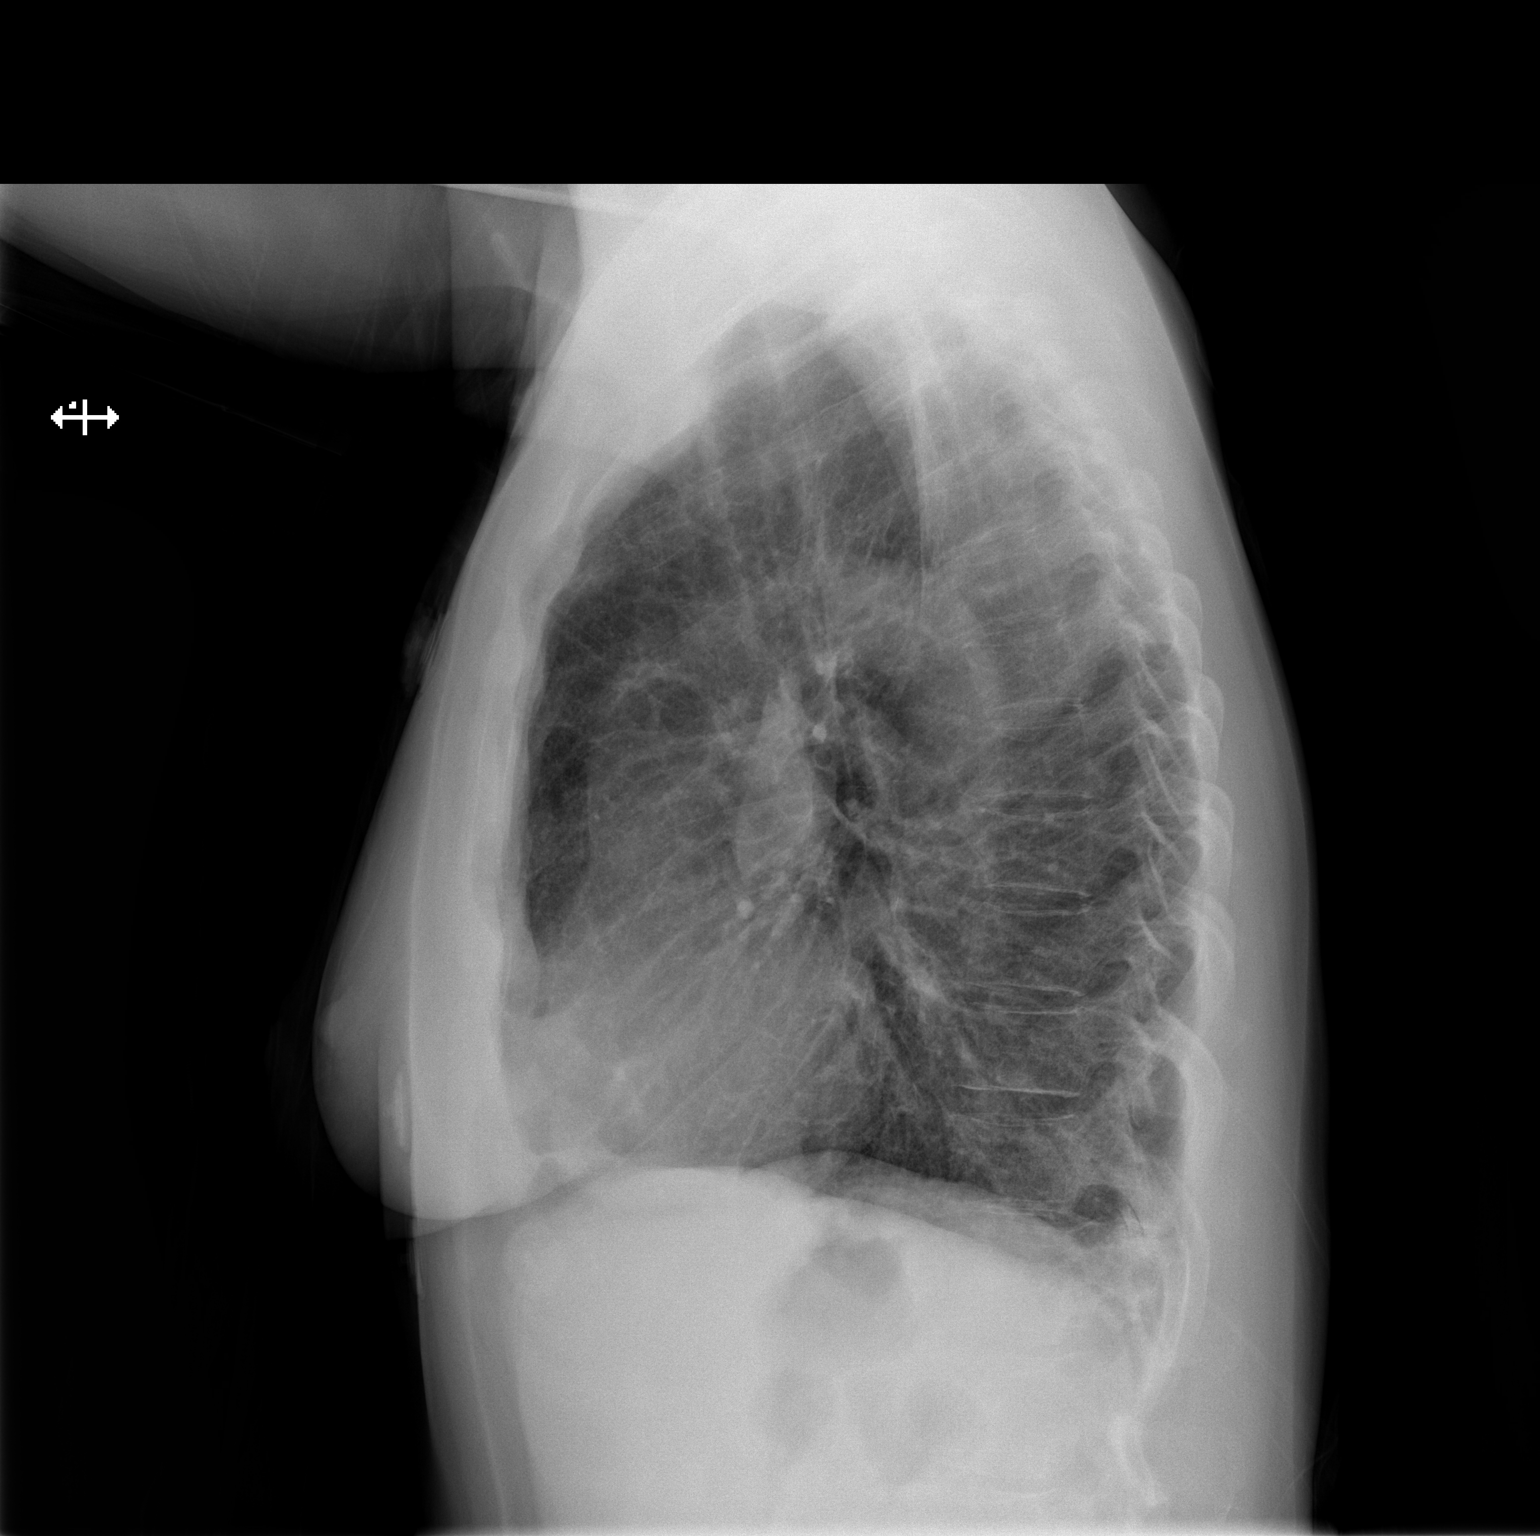

[2 of 2 positions shown; findings below may reference images not displayed]

FINDINGS: The heart size and mediastinal contours are within normal limits.
Pulmonary hyperinflation again noted, consistent with COPD. Both
lungs are clear. The visualized skeletal structures are
unremarkable.
IMPRESSION: COPD. No active cardiopulmonary disease.

## 2022-08-04 IMAGING — CR DG CERVICAL SPINE 2 OR 3 VIEWS
3 series · 3 of 3 positions shown · non-contrast
Comparison: 06/12/2012 cervical spine CT

CLINICAL DATA: Bilateral neck pain when coughing, right chest pain,
no reported injury

EXAM:
CERVICAL SPINE - 2-3 VIEW

[w cervical spine lat]
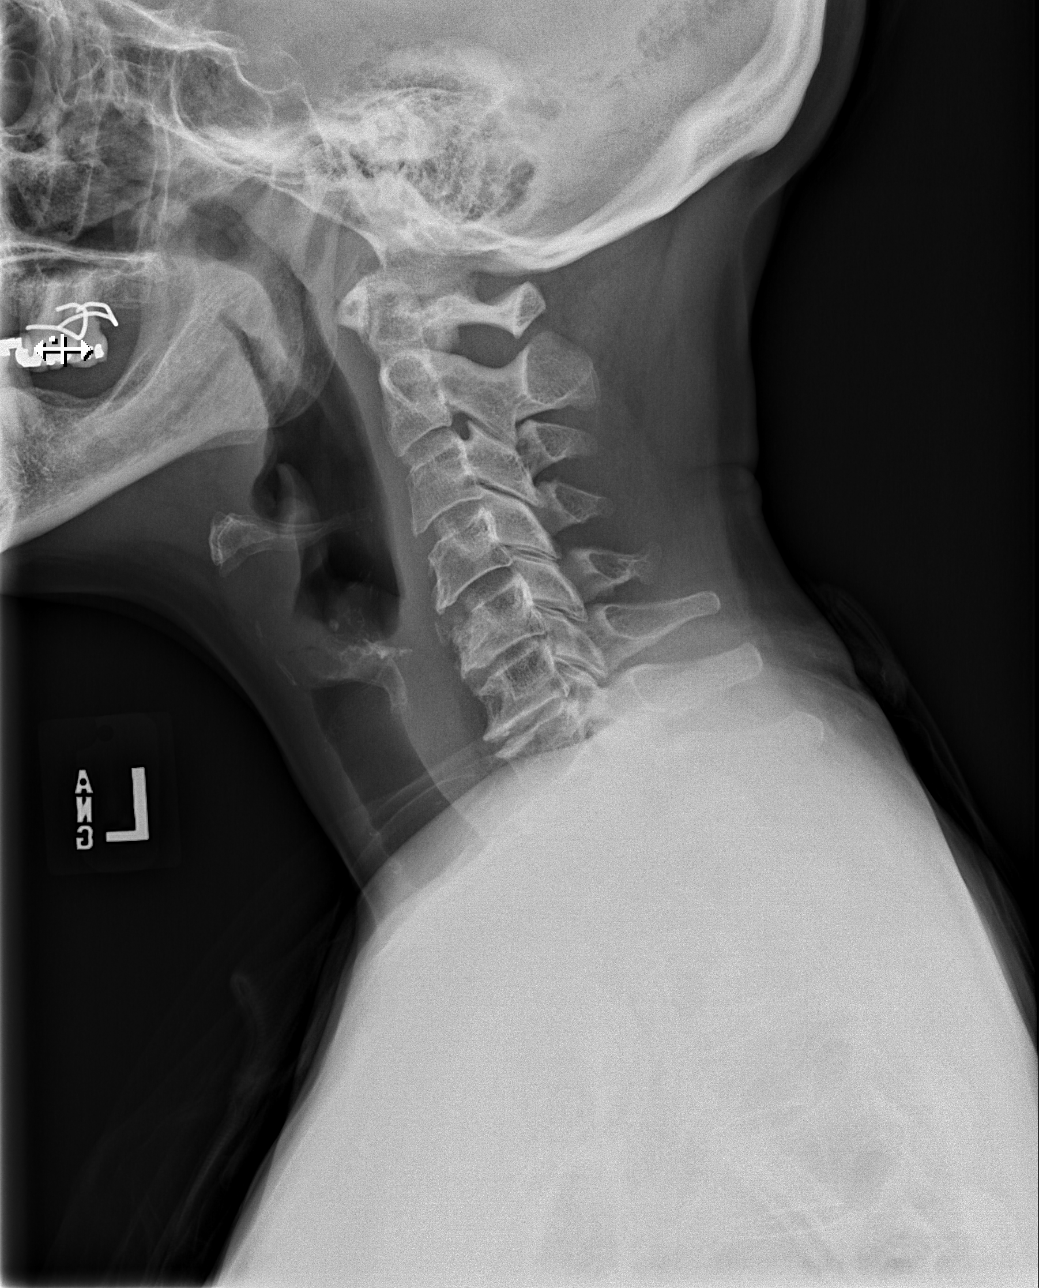

[w cervical spine ap]
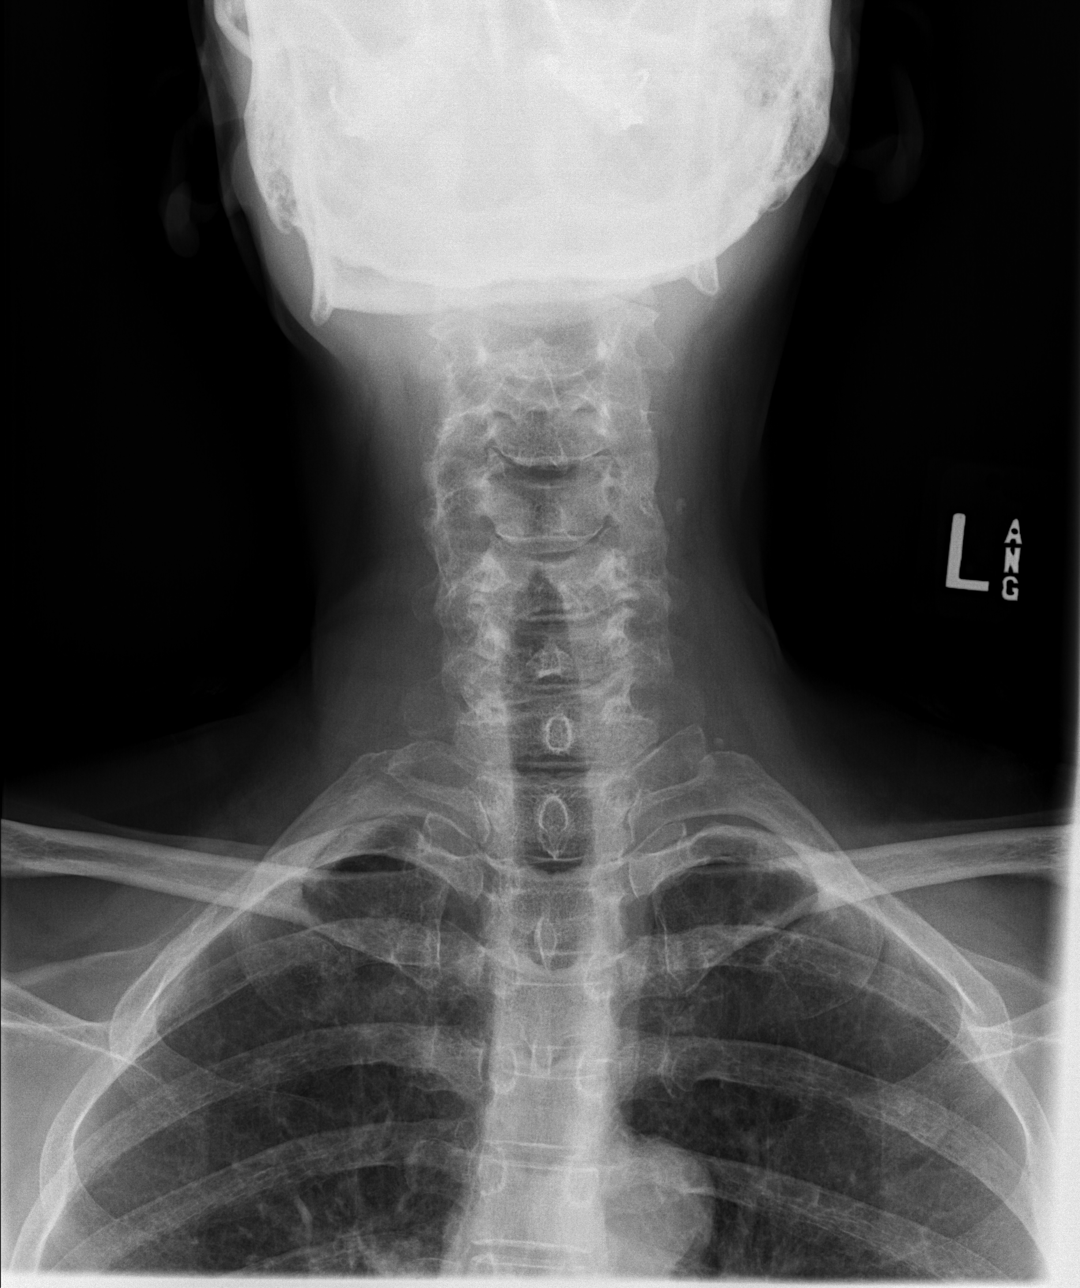

[w cervical spine odontoid]
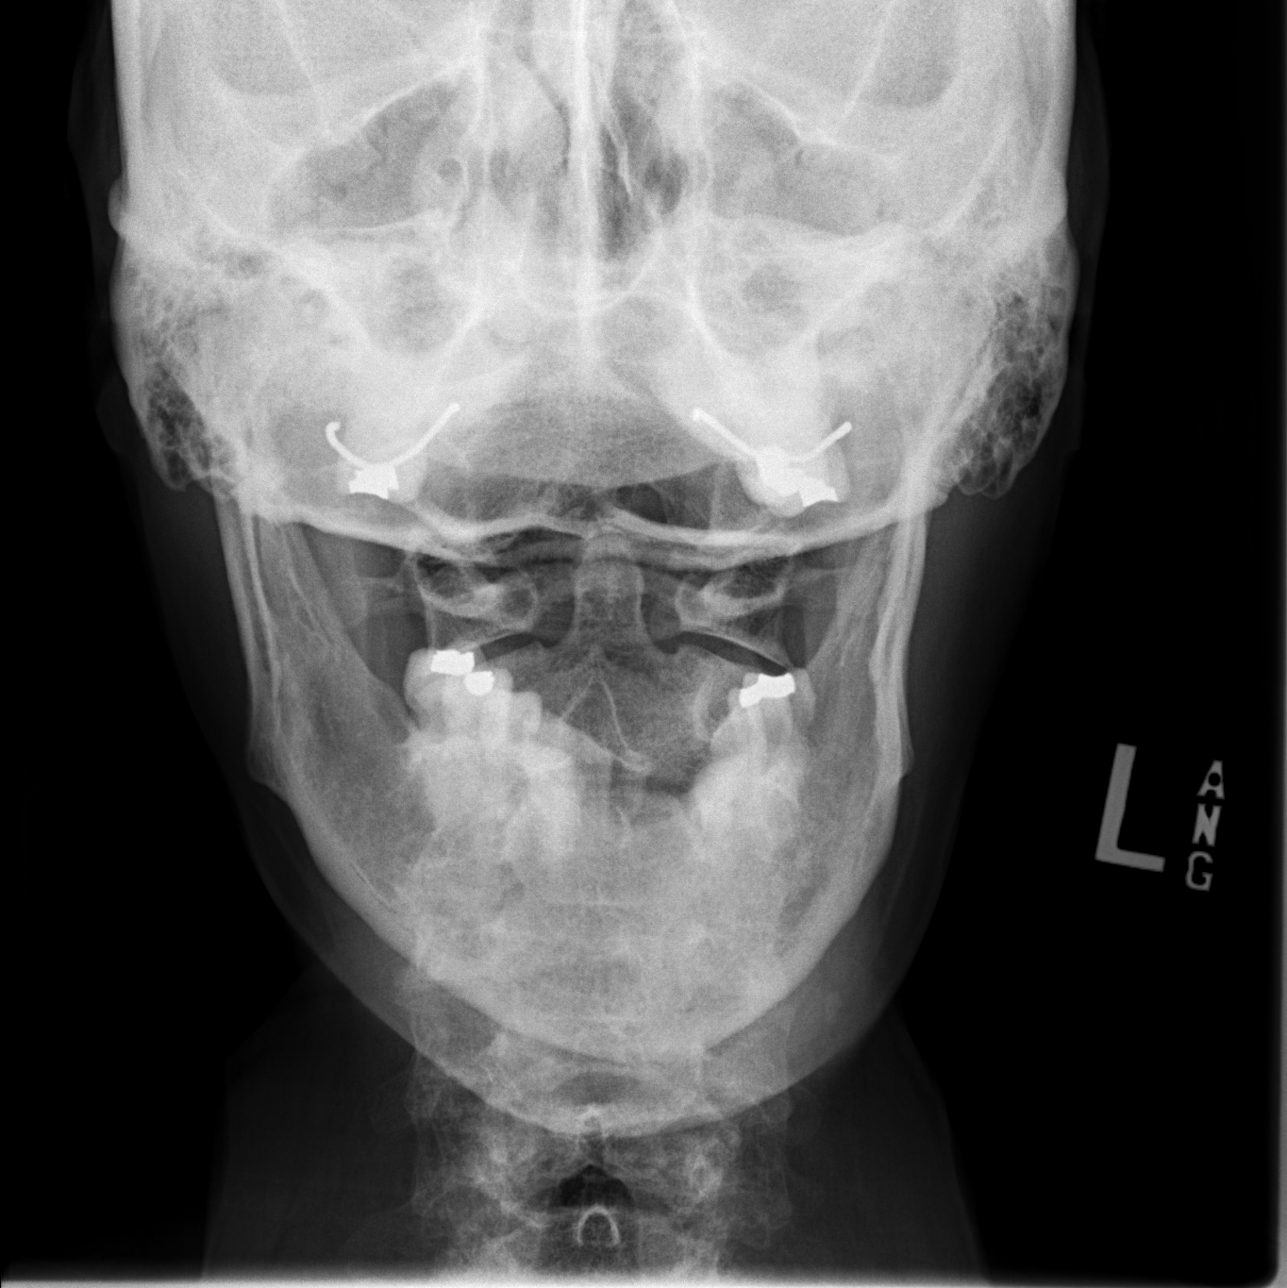

[3 of 3 positions shown; findings below may reference images not displayed]

FINDINGS: On the lateral view the cervical spine is visualized to the level of
C6-7 with nonvisualization of C7-T1. Straightening of the cervical
spine. Pre-vertebral soft tissues are within normal limits. No
fracture is detected in the cervical spine. Dens is well positioned
between the lateral masses of C1. Moderate multilevel degenerative
disc disease in the mid to lower cervical spine, most prominent at
C5-6 and C6-7. No cervical spine subluxation. Mild bilateral
cervical facet arthropathy. No aggressive-appearing focal osseous
lesions.
IMPRESSION: 1. Straightening of the cervical spine, usually due to positioning
and/or muscle spasm.
2. No acute osseous abnormality.
3. Moderate multilevel degenerative disc disease in the mid to lower
cervical spine, most prominent at C5-6 and C6-7.

## 2022-10-08 ENCOUNTER — Telehealth: Payer: Self-pay

## 2022-10-08 NOTE — Telephone Encounter (Signed)
Sending mychart msg. AS, CMA 

## 2023-01-09 ENCOUNTER — Encounter: Payer: Self-pay | Admitting: Physician Assistant

## 2023-01-20 ENCOUNTER — Ambulatory Visit: Payer: Non-veteran care | Admitting: Dietician

## 2023-01-31 ENCOUNTER — Ambulatory Visit: Payer: Non-veteran care | Admitting: Dietician

## 2023-03-20 ENCOUNTER — Encounter: Payer: Self-pay | Admitting: Physician Assistant

## 2023-03-20 ENCOUNTER — Ambulatory Visit (INDEPENDENT_AMBULATORY_CARE_PROVIDER_SITE_OTHER): Payer: No Typology Code available for payment source | Admitting: Physician Assistant

## 2023-03-20 ENCOUNTER — Other Ambulatory Visit: Payer: Self-pay | Admitting: *Deleted

## 2023-03-20 VITALS — BP 114/70 | HR 80 | Ht 66.25 in | Wt 136.4 lb

## 2023-03-20 DIAGNOSIS — R1032 Left lower quadrant pain: Secondary | ICD-10-CM

## 2023-03-20 DIAGNOSIS — M7918 Myalgia, other site: Secondary | ICD-10-CM

## 2023-03-20 DIAGNOSIS — K219 Gastro-esophageal reflux disease without esophagitis: Secondary | ICD-10-CM

## 2023-03-20 DIAGNOSIS — R1031 Right lower quadrant pain: Secondary | ICD-10-CM | POA: Diagnosis not present

## 2023-03-20 DIAGNOSIS — Z1211 Encounter for screening for malignant neoplasm of colon: Secondary | ICD-10-CM | POA: Diagnosis not present

## 2023-03-20 MED ORDER — PEG-KCL-NACL-NASULF-NA ASC-C 100 G PO SOLR: 1.0000 | Freq: Once | ORAL | 0 refills | Status: AC

## 2023-03-20 NOTE — Patient Instructions (Signed)
NO drugs 72 hours prior to procedure.   Come in for drug test morning of procedure.   You have been scheduled for an endoscopy and colonoscopy. Please follow the written instructions given to you at your visit today.  Please pick up your prep supplies at the pharmacy within the next 1-3 days.  If you use inhalers (even only as needed), please bring them with you on the day of your procedure.  DO NOT TAKE 7 DAYS PRIOR TO TEST- Trulicity (dulaglutide) Ozempic, Wegovy (semaglutide) Mounjaro (tirzepatide) Bydureon Bcise (exanatide extended release)  DO NOT TAKE 1 DAY PRIOR TO YOUR TEST Rybelsus (semaglutide) Adlyxin (lixisenatide) Victoza (liraglutide) Byetta (exanatide) ___________________________________________________________________________  _______________________________________________________  If your blood pressure at your visit was 140/90 or greater, please contact your primary care physician to follow up on this.  _______________________________________________________  If you are age 5 or older, your body mass index should be between 23-30. Your Body mass index is 21.85 kg/m. If this is out of the aforementioned range listed, please consider follow up with your Primary Care Provider.  If you are age 32 or younger, your body mass index should be between 19-25. Your Body mass index is 21.85 kg/m. If this is out of the aformentioned range listed, please consider follow up with your Primary Care Provider.   ________________________________________________________  The [facility] providers would like to encourage you to use [facility] to communicate with providers for non-urgent requests or questions.  Due to long hold times on the telephone, sending your provider a message by [facility] may be a faster and more efficient way to get a response.  Please allow 48 business hours for a response.  Please remember that this is for non-urgent requests.   _______________________________________________________

## 2023-03-20 NOTE — Progress Notes (Signed)
Chief Complaint: Lower abdominal cramping, Screening for CRC, "Butt pain", GERD  HPI:    Lori Houston is a 62 year old African-American female with a past medical history as listed below including alcohol dependency, COPD, cocaine use and PTSD as well as GERD, who was referred to me by Lavinia Sharps, NP for a complaint of lower abdominal cramping, screening for colorectal cancer, " butt pain" as well as GERD.      Today, patient presents to clinic and tells me that she is actively using drugs including cocaine.  Tells me it would be very hard for her to stop the use and she takes them for some of her pain that she experiences every day.  Describes being sent here for multiple reasons.  She has a history of reflux but tells me this is well-controlled now that she has been started on Omeprazole 20 mg which she takes once daily.  She has never had an endoscopy and was told she needed one.    Also describes "butt pain", tells me that she was homeless but now is not and is trying to get her life back together.  She was very skinny at 1 point and tells me she sat on a lot of hard cement and ever since then she has discomfort on her bones.  This is mostly when she is just sitting.  Tells me she just had an MRI of her pelvis yesterday for this reason as well as lower abdominal cramping.      Describes cramping in her lower abdomen which happens throughout the day, somewhat relieved by her Cocaine use.  Apparently she was told that after menopause it can be normal to have some cramping down there.  She just wants to make sure that someone is treating it.  This is being worked up by the [place].    Denies fever, chills or weight loss.  Not seeing any blood in her stool.  Past Medical History:  Diagnosis Date   Alcohol dependency (HCC)    Anxiety    Arthritis    Bruxism    COPD (chronic obstructive pulmonary disease) (HCC)    Depression    Drug use    GERD (gastroesophageal reflux disease)    HLD  (hyperlipidemia)    HTN (hypertension)    PTSD (post-traumatic stress disorder)     Past Surgical History:  Procedure Laterality Date   TUBAL LIGATION     WRIST RECONSTRUCTION Bilateral    plates    Current Outpatient Medications  Medication Sig Dispense Refill   albuterol (VENTOLIN HFA) 108 (90 Base) MCG/ACT inhaler Inhale 2 puffs into the lungs every 4 (four) hours as needed.     aspirin EC 81 MG tablet Take 81 mg by mouth daily.     atorvastatin (LIPITOR) 20 MG tablet Take 20 mg by mouth daily.     loratadine (CLARITIN) 10 MG tablet Take 10 mg by mouth daily as needed.     meloxicam (MOBIC) 15 MG tablet Take 15 mg by mouth daily.     methocarbamol (ROBAXIN) 750 MG tablet Take 750 mg by mouth 4 (four) times daily.     montelukast (SINGULAIR) 10 MG tablet Take 10 mg by mouth at bedtime.     omeprazole (PRILOSEC) 20 MG capsule Take 20 mg by mouth daily.     naloxone (NARCAN) nasal spray 4 mg/0.1 mL Place 1 spray into the nose once. (Patient not taking: Reported on 03/20/2023)     No current  facility-administered medications for this visit.    Allergies as of 03/20/2023   (No Known Allergies)    Family History  Problem Relation Age of Onset   Diabetes Mother    Cancer Father        Neck and throat   Multiple sclerosis Brother    Multiple sclerosis Cousin     Social History   Socioeconomic History   Marital status: Divorced    Spouse name: Not on file   Number of children: 2   Years of education: Not on file   Highest education level: Not on file  Occupational History   Occupation: Disabled Vet  Tobacco Use   Smoking status: Every Day    Current packs/day: 1.00    Types: Cigarettes   Smokeless tobacco: Never  Vaping Use   Vaping status: Never Used  Substance and Sexual Activity   Alcohol use: Yes    Alcohol/week: 25.0 standard drinks of alcohol    Types: 25 Cans of beer per week    Comment: 6 pack per day   Drug use: Yes    Types: Cocaine, Marijuana    Sexual activity: Yes    Birth control/protection: None  Other Topics Concern   Not on file  Social History Narrative   Not on file   Social Determinants of Health   Financial Resource Strain: Not on file  Food Insecurity: Not on file  Transportation Needs: Not on file  Physical Activity: Not on file  Stress: Not on file  Social Connections: Unknown (05/14/2022)   Received [facility], [facility]   Social Network    Social Network: Not on file  Intimate Partner Violence: Unknown (05/14/2022)   Received from [employer], [facility]   HITS    Physically Hurt: Not on file    Insult or Talk Down To: Not on file    Threaten Physical Harm: Not on file    Scream or Curse: Not on file    Review of Systems:    Constitutional: No weight loss, fever or chills Skin: No rash Cardiovascular: No chest pain Respiratory: +chronic congestion Gastrointestinal: See HPI and otherwise negative Genitourinary: No dysuria or change in urinary frequency Neurological: No headache, dizziness or syncope Musculoskeletal: No new muscle pain Hematologic: No bleeding  Psychiatric: +PTSD   Physical Exam:  Vital signs: BP 114/70 (BP Location: Left Arm, Patient Position: Sitting, Cuff Size: Normal)   Pulse 80   Ht 5' 6.25" (1.683 m) Comment: height measured without shoes  Wt 136 lb 6 oz (61.9 kg)   BMI 21.85 kg/m   Constitutional:   Hyperactive AA female appears to be in NAD, Well developed, Well nourished, alert and cooperative Head:  Normocephalic and atraumatic. Eyes:   PEERL, EOMI. No icterus. Conjunctiva pink. Ears:  Normal auditory acuity. Neck:  Supple Throat: Oral cavity and pharynx without inflammation, swelling or lesion.  Respiratory: Respirations even and unlabored. Lungs clear to auscultation bilaterally.   No wheezes, crackles, or rhonchi.  Cardiovascular: Normal S1, S2. No MRG. Regular rate and rhythm. No peripheral edema, cyanosis or pallor.  Gastrointestinal:  Soft,  nondistended, nontender. No rebound or guarding. Normal bowel sounds. No appreciable masses or hepatomegaly. Rectal:  Not performed.  Msk:  Symmetrical without gross deformities. Without edema, no deformity or joint abnormality.  Neurologic:  Alert and  oriented x4;  grossly normal neurologically.  Skin:   Dry and intact without significant lesions or rashes. Psychiatric: Limited insight, Hyperactive and switches topics quickly  RELEVANT LABS AND IMAGING: CBC    Component Value Date/Time   WBC 11.1 (H) 06/22/2022 1357   RBC 4.86 06/22/2022 1357   HGB 15.8 (H) 06/22/2022 1357   HCT 45.6 06/22/2022 1357   PLT 332 06/22/2022 1357   MCV 93.8 06/22/2022 1357   MCH 32.5 06/22/2022 1357   MCHC 34.6 06/22/2022 1357   RDW 13.1 06/22/2022 1357   LYMPHSABS 0.4 (L) 06/22/2022 1357   MONOABS 1.1 (H) 06/22/2022 1357   EOSABS 0.1 06/22/2022 1357   BASOSABS 0.0 06/22/2022 1357    CMP     Component Value Date/Time   NA 134 (L) 06/22/2022 1357   K 4.6 06/22/2022 1357   CL 98 06/22/2022 1357   CO2 26 06/22/2022 1357   GLUCOSE 102 (H) 06/22/2022 1357   BUN 12 06/22/2022 1357   CREATININE 0.92 06/22/2022 1357   CALCIUM 9.9 06/22/2022 1357   PROT 8.7 (H) 06/22/2022 1357   ALBUMIN 4.8 06/22/2022 1357   AST 22 06/22/2022 1357   ALT 14 06/22/2022 1357   ALKPHOS 62 06/22/2022 1357   BILITOT 0.6 06/22/2022 1357   GFRNONAA >60 06/22/2022 1357   GFRAA 74 (L) 06/25/2014 1226    Assessment: 1.  Screening for colorectal cancer: Patient has never had a screening colonoscopy 2.  Lower abdominal cramping: This sounds GYN related, pelvic MRI done yesterday per the patient, results have not been called to her 3.  Pain in her bottom: This sounds like bone pain, [facility] 4.  GERD: Controlled on Omeprazole 20 mg daily  Plan: 1.  Patient would likely benefit from diagnostic EGD and screening colonoscopy, but neither of these are urgent given the symptoms she  described.  Patient is actively using cocaine and other drugs.  We discussed this in detail today.  I told her that if we set her up for procedure she needs to be free of any drugs for 72 hours prior.  We we will also need to set up a urine tox screen for the morning of procedures and she will be set up for the last appointment of the day to allow [place] time to get these results back.  She verbalized understanding to all of this.  I discussed that if she turns up positive for drugs then she will not be able to have these procedures.  We discussed that she is at very high risk for death if she is on cocaine when we put her to sleep.  She still wished to proceed. 2.  Set the patient up for an EGD and colonoscopy with Dr. Myrtie Neither in the [facility].  Also discussed this case with him and the drug use.  Did provide the patient a detailed list of risks for the procedures and she agrees to proceed.  Again also discussed the possibility of death if she is on drugs when we put her to sleep. 3.  Await results from pelvic MRI done yesterday per the patient, it sounds as though lower abdominal cramping is more GYN related as it does not surround her bowel movements at all. 4.  Patient may also need further x-ray or other work up for her pain in her bottom as this seems to be related to possibly arthritis or other. 5.  Continue Omeprazole 20 mg daily 6.  Patient to follow in clinic per recommendations after time of procedures.  Hyacinth Meeker, PA-C [facility] 03/20/2023, 11:32 AM  Cc: Lavinia Sharps, NP

## 2023-03-20 NOTE — Progress Notes (Signed)
____________________________________________________________  Attending physician addendum:  Thank you for sending this case to me. I have reviewed the entire note and agree with the plan.  Thank you for discussing with me in clinic today. We will give her the benefit of the doubt with a chance to stop using cocaine long enough (72 hrs) to have her endoscopic procedures as long as she undergoes a stat urine toxicology screen first thing that morning to be certain it is negative.  If she is not compliant with that plan, then no further endoscopic testing will be offered and she will be returned to the care of her providers at the [facility].  Amada Jupiter, MD  ____________________________________________________________

## 2023-04-24 ENCOUNTER — Telehealth: Payer: Self-pay | Admitting: Gastroenterology

## 2023-04-24 ENCOUNTER — Encounter: Payer: Non-veteran care | Admitting: Gastroenterology

## 2023-04-24 NOTE — Telephone Encounter (Signed)
Hi Dr Myrtie Neither,  I called patient regarding his appointment, he did not respond and has not arrived for his procedure. I will no show him.   Thank you

## 2023-05-01 ENCOUNTER — Encounter: Payer: No Typology Code available for payment source | Attending: Internal Medicine | Admitting: Dietician

## 2023-05-01 ENCOUNTER — Encounter: Payer: Self-pay | Admitting: Dietician

## 2023-05-01 VITALS — Ht 66.0 in | Wt 132.6 lb

## 2023-05-01 DIAGNOSIS — I10 Essential (primary) hypertension: Secondary | ICD-10-CM | POA: Insufficient documentation

## 2023-05-01 DIAGNOSIS — E7800 Pure hypercholesterolemia, unspecified: Secondary | ICD-10-CM | POA: Insufficient documentation

## 2023-05-01 NOTE — Progress Notes (Signed)
Medical Nutrition Therapy  Appointment Start time:  10:55  Appointment End time:  11:40  Primary concerns today:  Referral diagnosis: Hypercholesterolemia, hypertension Preferred learning style: visual/hands on (auditory, visual, hands on, no preference indicated) Learning readiness: contemplating (not ready, contemplating, ready, change in progress)  NUTRITION ASSESSMENT   Anthropometrics  Weight: 132.6 lb Height:  66 in  Clinical Medical Hx: HTN,  Medications: lipitor, albuterol, singulair, robaxin, meloxicam, prilosec Labs: chol 217;  Notable Signs/Symptoms: falling asleep during appointment , hyper talkative  Lifestyle & Dietary Hx  Pt states she is trying to eat, stating she gets depressed when she goes to the store. Pt states she used to be homeless and would walk everywhere, stating she has a car now. Pt states she has back and neck pain she is getting treated for, stating she was given 6 needles/shots. Pt states dietitian doesn't need to talk long, stating she wants a quick fix. Pt stated can do the paperwork and come back for another appointment. Pt states her grandchild is the reason she takes her medications. Pt states she should be eligible for weed prescription Pt was falling asleep during appointment.  Estimated daily fluid intake: 64 oz Supplements: no Sleep: terrible; 4-5 hours a night with naps in the day Stress / self-care: smoke weed Current average weekly physical activity: ADLs also, push ups and crunches started last week  24-Hr Dietary Recall First Meal: medication, skip Snack:  Second Meal: salad or [place] burger or chili ([place]) or fajitas  Snack: potato chips Third Meal: chicken thighs, rice or potatoes or egg noodles, vegetable Snack: little Debbie snack cakes Beverages: beer, water (some, but likes to have flavorings added)  Estimated Energy Needs Calories: 2000  NUTRITION DIAGNOSIS  NB-1.1 Food and nutrition-related knowledge deficit As  related to high cholesterol and retention to education during appointments.  As evidenced by labs and falling asleep during appointment..  NUTRITION INTERVENTION  Nutrition education (E-1) on the following topics:  Fruits & Vegetables: Aim to fill half your plate with a variety of fruits and vegetables. They are rich in vitamins, minerals, and fiber, and can help reduce the risk of chronic diseases. Choose a colorful assortment of fruits and vegetables to ensure you get a wide range of nutrients. Grains and Starches: Make at least half of your grain choices whole grains, such as Georgena Weisheit rice, whole wheat bread, and oats. Whole grains provide fiber, which aids in digestion and healthy cholesterol levels. Aim for whole forms of starchy vegetables such as potatoes, sweet potatoes, beans, peas, and corn, which are fiber rich and provide many vitamins and minerals.  Protein: Incorporate lean sources of protein, such as poultry, fish, beans, nuts, and seeds, into your meals. Protein is essential for building and repairing tissues, staying full, balancing blood sugar, as well as supporting immune function. Dairy: Include low-fat or fat-free dairy products like milk, yogurt, and cheese in your diet. Dairy foods are excellent sources of calcium and vitamin D, which are crucial for bone health.  Physical Activity: Aim for 150 minutes of physical activity weekly. Regular physical activity promotes overall health-including helping to reduce risk for heart disease and diabetes, promoting mental health, and helping [place] sleep better.  Why you need complex carbohydrates: Whole grains and other complex carbohydrates are required to have a healthy diet. Whole grains provide fiber which can help with blood glucose levels and help keep you satiated. Fruits and starchy vegetables provide essential vitamins and minerals required for immune function, eyesight support, brain  support, bone density, wound healing and many other  functions within the body. According to the current evidenced based 2020-2025 Dietary Guidelines for Americans, complex carbohydrates are part of a healthy eating pattern which is associated with a decreased risk for type 2 diabetes, cancers, and cardiovascular disease.   Handouts Provided Include  Types of Fat (saturated vs unsaturated) Meal Ideas handout Health benefits of physical activity  Learning Style & Readiness for Change Teaching method utilized: Visual & Auditory  Demonstrated degree of understanding via: Teach Back  Barriers to learning/adherence to lifestyle change: smoking weed, falling asleep during the appointment.  Goals Established by Pt Increase physical activity: crunches and push up, two times daily for 15 minutes. Increase water intake; aim for 64 oz. Avoid skipping meals; eat breakfast daily as well as lunch and supper; eat snacks as needed. Aim for two or more servings of non-starchy vegetables; less processed foods; eat complex carbohydrates, 2-3 servings at each meal. Eat protein source with every meal or snack  MONITORING & EVALUATION Dietary intake, weekly physical activity, body weight.  Next Steps  Patient is to follow-up in one month for further education.

## 2023-05-30 ENCOUNTER — Ambulatory Visit (INDEPENDENT_AMBULATORY_CARE_PROVIDER_SITE_OTHER): Payer: Non-veteran care

## 2023-05-30 ENCOUNTER — Ambulatory Visit (HOSPITAL_COMMUNITY)
Admission: EM | Admit: 2023-05-30 | Discharge: 2023-05-30 | Discharge status: Against Medical Advice | Payer: Non-veteran care | Attending: Physician Assistant | Admitting: Physician Assistant

## 2023-05-30 DIAGNOSIS — S93401A Sprain of unspecified ligament of right ankle, initial encounter: Secondary | ICD-10-CM

## 2023-05-30 DIAGNOSIS — M25571 Pain in right ankle and joints of right foot: Secondary | ICD-10-CM | POA: Diagnosis not present

## 2023-05-30 NOTE — ED Provider Notes (Signed)
[facility]    CSN: [id number] Arrival date & time: 05/30/23  1820      History   Chief Complaint Chief Complaint  Patient presents with   Ankle Pain   Foot Pain    HPI Lori Houston is a 62 y.o. female.   Patient reports that at around 5 AM she missed a step and rolled her ankle.  She has had ongoing pain since that time.  She reports pain is rated 8 on a 0-10 pain scale, described as throbbing/sharp, worse with attempted movement, no alleviating factors identified.  She denies previous injury or surgery involving her foot.  She did place her ankle in a bandage and then used a friend's cam boot and this has provided improvement of symptoms.  She is also tried ibuprofen.  She is confident that she is not pregnant as she is postmenopausal.  She denies any numbness or paresthesias in the foot.  She reports the pain is improving but is concerned because it continues to be significant.    Past Medical History:  Diagnosis Date   Alcohol dependency (HCC)    Anxiety    Arthritis    Bruxism    COPD (chronic obstructive pulmonary disease) (HCC)    Depression    Drug use    GERD (gastroesophageal reflux disease)    HLD (hyperlipidemia)    HTN (hypertension)    PTSD (post-traumatic stress disorder)     Patient Active Problem List   Diagnosis Date Noted   Low back pain syndrome 06/02/2014    Past Surgical History:  Procedure Laterality Date   TUBAL LIGATION     WRIST RECONSTRUCTION Bilateral    plates    OB History     Gravida  3   Para      Term      Preterm      AB  1   Living         SAB      IAB      Ectopic      Multiple      Live Births               Home Medications    Prior to Admission medications   Medication Sig Start Date End Date Taking? Authorizing Provider  albuterol (VENTOLIN HFA) 108 (90 Base) MCG/ACT inhaler Inhale 2 puffs into the lungs every 4 (four) hours as needed. 07/04/22  Yes [provider]   aspirin EC 81 MG tablet Take 81 mg by mouth daily. 07/04/22  Yes [provider]  atorvastatin (LIPITOR) 20 MG tablet Take 20 mg by mouth daily. 12/12/22  Yes [provider]  loratadine (CLARITIN) 10 MG tablet Take 10 mg by mouth daily as needed. 01/11/23  Yes [provider]  montelukast (SINGULAIR) 10 MG tablet Take 10 mg by mouth at bedtime. 01/11/23  Yes [provider]  omeprazole (PRILOSEC) 20 MG capsule Take 20 mg by mouth daily.   Yes [provider]  meloxicam (MOBIC) 15 MG tablet Take 15 mg by mouth daily. 10/31/22   [provider]  methocarbamol (ROBAXIN) 750 MG tablet Take 750 mg by mouth 4 (four) times daily.    [provider]  naloxone [facility]) nasal spray 4 mg/0.1 mL Place 1 spray into the nose once. Patient not taking: Reported on 03/20/2023 02/12/23   [provider]    Family History Family History  Problem Relation Age of Onset   Diabetes  Mother    Cancer Father        Neck and throat   Multiple sclerosis Brother    Multiple sclerosis Cousin     Social History Social History   Tobacco Use   Smoking status: Every Day    Current packs/day: 1.00    Types: Cigarettes   Smokeless tobacco: Never  Vaping Use   Vaping status: Never Used  Substance Use Topics   Alcohol use: Yes    Alcohol/week: 25.0 standard drinks of alcohol    Types: 25 Cans of beer per week    Comment: 6 pack per day   Drug use: Yes    Types: Cocaine, Marijuana     Allergies   Patient has no known allergies.   Review of Systems Review of Systems  Constitutional:  Positive for activity change. Negative for appetite change, fatigue and fever.  Musculoskeletal:  Positive for arthralgias, gait problem and joint swelling. Negative for myalgias.  Neurological:  Negative for weakness and numbness.     Physical Exam Triage Vital Signs ED Triage Vitals  Encounter Vitals Group     BP 05/30/23 1930 (!) 133/113     Systolic  BP Percentile --      Diastolic BP Percentile --      Pulse Rate 05/30/23 1930 82     Resp 05/30/23 1930 18     Temp 05/30/23 1930 98.3 F (36.8 C)     Temp Source 05/30/23 1930 Oral     SpO2 05/30/23 1930 99 %     Weight --      Height --      Head Circumference --      Peak Flow --      Pain Score 05/30/23 1931 9     Pain Loc --      Pain Education --      Exclude from Growth Chart --    No data found.  Updated Vital Signs BP (!) 133/113 (BP Location: Left Arm)   Pulse 82   Temp 98.3 F (36.8 C) (Oral)   Resp 18   SpO2 99%   Visual Acuity Right Eye Distance:   Left Eye Distance:   Bilateral Distance:    Right Eye Near:   Left Eye Near:    Bilateral Near:     Physical Exam Vitals reviewed.  Constitutional:      General: She is awake. She is not in acute distress.    Appearance: Normal appearance. She is well-developed. She is not ill-appearing.     Comments: Very pleasant female appears stated age in no acute distress sitting comfortably in exam room  HENT:     Head: Normocephalic and atraumatic.  Cardiovascular:     Rate and Rhythm: Normal rate and regular rhythm.     Pulses:          Posterior tibial pulses are 2+ on the right side.     Heart sounds: Normal heart sounds, S1 normal and S2 normal. No murmur heard.    Comments: Capillary fill within 2 seconds right toes Pulmonary:     Effort: Pulmonary effort is normal.     Breath sounds: Normal breath sounds. No wheezing, rhonchi or rales.  Musculoskeletal:     Right lower leg: No edema.     Left lower leg: No edema.     Right ankle: Swelling present. Tenderness present over the lateral malleolus. Decreased range of motion.     Comments: Right ankle/foot: Tenderness over  lateral malleolus and along anterior foot.  No deformity noted.  Patient is able to bear weight but with difficulty.  Foot is neurovascularly intact.  Psychiatric:        Behavior: Behavior is cooperative.      UC Treatments / Results   Labs (all labs ordered are listed, but only abnormal results are displayed) Labs Reviewed - No data to display  EKG   Radiology No results found.  Procedures Procedures (including critical care time)  Medications Ordered in UC Medications - No data to display  Initial Impression / Assessment and Plan / UC Course  I have reviewed the triage vital signs and the nursing notes.  Pertinent labs & imaging results that were available during my care of the patient were reviewed by me and considered in my medical decision making (see chart for details).     Patient is well-appearing, afebrile, nontoxic, nontachycardic.  Foot was neurovascularly intact.  X-ray was obtained and results were pending when patient left.  She was unable to wait for these results we will contact her tomorrow if they are abnormal.  Attempted to call patient to discuss treatment plan but was unable to reach her so left voicemail for return call.  I will try to call her again tomorrow once we receive her results.  We will make additional recommendations based on her results and attempt to contact her again during clinic tomorrow.  Final Clinical Impressions(s) / UC Diagnoses   Final diagnoses:  Sprain of right ankle, unspecified ligament, initial encounter  Acute right ankle pain   Discharge Instructions   None    ED Prescriptions   None    PDMP not reviewed this encounter.   Jeani Hawking, Cordelia Poche 05/30/23 2127

## 2023-05-30 NOTE — ED Notes (Addendum)
Patient states she is feeling better, can bear weight on the ankle/foot, and she no longer wants to wait to be seen. Patient made aware of the possibility that x-ray results may not be released as she left without seeing the provider. Patient verbalized understanding.  Patient leaving AMA.

## 2023-05-30 NOTE — ED Triage Notes (Signed)
Here for left side foot pain after rolling her ankle today.

## 2023-05-31 ENCOUNTER — Telehealth (HOSPITAL_COMMUNITY): Payer: Self-pay | Admitting: Physician Assistant

## 2023-05-31 MED ORDER — IBUPROFEN 800 MG PO TABS: 800.0000 mg | ORAL_TABLET | Freq: Three times a day (TID) | ORAL | 0 refills | Status: DC | PRN

## 2023-05-31 NOTE — Telephone Encounter (Signed)
Called to discuss negative x-ray results with patient.  She was still having pain.  Ibuprofen was sent to pharmacy per her request.  Discussed that she is not to take NSAIDs with this medication.  If she has persistent symptoms she is to return for reevaluation.  All questions were answered to patient satisfaction.

## 2023-06-01 ENCOUNTER — Telehealth: Payer: Self-pay

## 2023-07-01 ENCOUNTER — Ambulatory Visit: Payer: Non-veteran care | Admitting: Dietician

## 2024-01-12 NOTE — Interval H&P Note (Signed)
 H&P reviewed, patient examined and there is NO CHANGE to the patient status.

## 2024-01-12 NOTE — H&P (Signed)
 Assessment    63 y.o. female with      Chief Complaint  Patient presents with  . Neck Pain      Right sided neck pain that radiates to her right shoulder. She rates her pain a 10/10.   , consistent with 1. Cervical spondylosis without myelopathy       2. Arthropathy of cervical facet joint        3. DDD (degenerative disc disease), cervical        4. Chronic pain syndrome              Plan      Urine drug screen was not obtained today to establish a baseline for the patient.   A [place]  controlled substance database lemmie was performed in the PMP AW[place]  reviewing patient's 12 month history and was appropriate.   Opioid risk tool score: risk OPIOID RISK TOOL  More data may exist        02/27/2023  Opioid Risk Tool  Family history alcohol  1  Family history illegal drugs 0  Family history rx drugs 0  Personal history alcohol 3  Personal history illegal drugs 0  Personal history rx drugs 0  Age between 16-45 years 0  History of preadolescent sexual abuse 0  ADD, OCD, bipolar, schizophrenia 0  Depression 0  Scoring Total 4  Interpretation Moderate risk for opioid abuse        Opioid Medications: None prescribed. Not a candidate.   Adjuvant Medications: None prescribed.   Specialized Treatments/Imaging/Procedures: We discussed the benefits of physical therapy. We will send in a referral to physical therapy for further evaluation and treatment.    Injections: Will plan for right-sided C4-5, C5-C6 RFA at [facility] with IV sedation.  This procedure was discussed in detail with the patient. Risks/benefits/alternatives discussed. All questions were answered to their satisfaction.    Follow up: Follow up for right C4-6 RFA at [facility].         History of Present Illness  Patient was referred to [facility] by No ref. provider found   Initial HPI: Lori Houston presents to [facility] for the evaluation of       Chief  Complaint  Patient presents with  . Neck Pain      Right sided neck pain that radiates to her right shoulder. She rates her pain a 10/10.      History of Present Illness The patient presents for evaluation of neck pain.   The patient reports persistent neck pain. She reports frequent grinding and grinding sounds. She experiences daily pain upon waking. Previously, she experienced persistent tingling radiating down her arms, which has since resolved following dietary modifications. Notes reduced ROM of the neck. Notes that stretching appears to alleviate some pain in her neck. Denies any weakness. Has recently been seen and evaluated by Neurosurgery, referred to our clinic for further evaluation and consideration of procedures. She expresses a desire for potential injections. She was also prescribed Methocarbamol 750mg  TID PRN, however has not yet filled this.      Pain rating: 10 out of 10 Pain quality: Aching, Discomfort, Headache, Itching, Nagging, Pins-and-needles, Sore, Spasm, Throbbing, Tightness, Tingling, Grinding, Knife-like, Needle-like Aggrevating factors: Bending, Stretching Alleviating factors: - Activities affected by pain: Social interaction, Exercise Previous evaluations: Neurosurgery   Physical Therapy/Home Exercise:  HEP   Current Pain Medications: - Opioids: None - NSAIDs:  Advil  - Anti-Depressants: None - Anti-Convulsants: None -  Others:  Methocarbamol - Anticoagulants: None   Previous Interventions/Procedures: None   Imaging  XR Cervical Spine 09/22/2021 FINDINGS:  On the lateral view the cervical spine is visualized to the level of  C6-7 with nonvisualization of C7-T1. Straightening of the cervical  spine. Pre-vertebral soft tissues are within normal limits. No  fracture is detected in the cervical spine. Dens is well positioned  between the lateral masses of C1. Moderate multilevel degenerative  disc disease in the mid to lower cervical spine, most  prominent at  C5-6 and C6-7. No cervical spine subluxation. Mild bilateral  cervical facet arthropathy. No aggressive-appearing focal osseous  lesions.   IMPRESSION:  1. Straightening of the cervical spine, usually due to positioning  and/or muscle spasm.  2. No acute osseous abnormality.  3. Moderate multilevel degenerative disc disease in the mid to lower  cervical spine, most prominent at C5-6 and C6-7.      I personally reviewed and visualized the imaging studies if available.    Review of Systems  A complete 13 point review of systems was performed and reviewed, the pertinent findings are documented in history of present illness, see intake form from today's examination for full details.   Denies new numbness or weakness Denies current chest pain, SOB, Oversedation     Past Medical History      Reviewed and updated this visit by provider: Tobacco  Allergies  Meds  Problems  Med Hx  Surg Hx  Fam Hx              Physical Exam  BP:      Resp:                 Pulse:          SPO2:     General: Well developed, well nourished female. No acute distress.   Psychiatric:  Cooperative, Appropriate mood & affect, Normal judgment.   HEENT:  Normocepalic, pupils are equal and round, conjunctiva is clear, mucous membranes moist Neck: Cervical flexion 60, cervical extension 50, cervical rotation 70 and equal bilaterally. Lateral bend 30 and equal bilaterally. There is concordant pain in the paracervical area with these maneuvers. No pain with palpation of the cervical spinous processes. There is pain with palpation of the cervical facets. Noted cervical and paracervical muscle spasm. Skin:  Warm, Dry, Pink, No rash.     Respiratory:  Respirations are non-labored, symmetrical chest wall expansion.   Cardiovascular:  Well perfused. No evidence of cyanosis/edema.   Extremities: Upper extremity motor strength appears to be grossly intact. Grip strength is intact.  Neuro: Alert  and oriented, speech pressured

## 2024-04-20 NOTE — Progress Notes (Signed)
 Assessment  63 y.o. female with  Chief Complaint  Patient presents with  . Follow-up    Follow up - bilateral SJI   , consistent with 1. Sacroiliitis      2. Lumbar spondylosis      3. Cervical facet joint syndrome      4. Degeneration of intervertebral disc of lumbar region with discogenic back pain      5. Chronic pain syndrome           Plan     Opioid Medications: None prescribed. Not a candidate. History of polysubstance abuse.  Adjuvant Medications: None prescribed.  Specialized Treatments/Imaging/Procedures: Continue PT/HEP.  Reports she recently had x-rays done over at the [place].  Notified patient that I do not have access to these images and recommended she obtain these images on a disk and bring them to our office for further evaluation.  Patient verbalized understanding.  Injections: Already scheduled for bilateral SI joint injections in [facility].  Is aware she can get RFA's repeated every 6 months as needed.  Follow up: Follow up for after ASC.     History of Present Illness  Initial HPI: Lori Houston is a 63 year old female who presents with complaints of persistent neck pain. She reports frequent grinding and grinding sounds. She experiences daily pain upon waking. Previously, she experienced persistent tingling radiating down her arms, which has since resolved following dietary modifications. Notes reduced ROM of the neck. Notes that stretching appears to alleviate some pain in her neck. Denies any weakness. Has recently been seen and evaluated by Neurosurgery, referred to our clinic for further evaluation and consideration of procedures. She expresses a desire for potential injections. She was also prescribed Methocarbamol 750mg  TID PRN, however has not yet filled this.     Interval HPI: 04/20/2024  Since last visit, Lori Houston' s pain is stable.   Today patient is here for follow-up.  States that she was under the impression this appointment was to discuss and review her  x-rays that were done over at the [place].  Continues to report low back pain along with intermittent right sided neck pain.  Is scheduled to have bilateral SI joint injections done next week.  Patient states that she thought she had already received these injections, but was notified that this procedure has been rescheduled.  Patient denies any new or worsening numbness, tingling, weakness to bilateral lower extremities.   Procedures: 01/12/2024-right C4-5, C5-6 RFA - 75%    Narcotic Violations: N/a   Pill count: N/a  Last RUDS: N/a   Imaging  XR Cervical Spine 09/22/2021 FINDINGS:  On the lateral view the cervical spine is visualized to the level of  C6-7 with nonvisualization of C7-T1. Straightening of the cervical  spine. Pre-vertebral soft tissues are within normal limits. No  fracture is detected in the cervical spine. Dens is well positioned  between the lateral masses of C1. Moderate multilevel degenerative  disc disease in the mid to lower cervical spine, most prominent at  C5-6 and C6-7. No cervical spine subluxation. Mild bilateral  cervical facet arthropathy. No aggressive-appearing focal osseous  lesions.   IMPRESSION:  1. Straightening of the cervical spine, usually due to positioning  and/or muscle spasm.  2. No acute osseous abnormality.  3. Moderate multilevel degenerative disc disease in the mid to lower  cervical spine, most prominent at C5-6 and C6-7.    I personally reviewed and visualized the imaging studies if available.    Past Medical History  Reviewed and updated this visit by provider: Tobacco  Allergies  Meds  Problems  Med Hx  Surg Hx  Fam Hx         Physical Exam  BP: 130/84 Resp:      Pulse: 91        SPO2:    General: Well developed, well nourished female. No acute distress.   Psychiatric:  Cooperative, Appropriate mood & affect, Normal judgment.   HEENT:  Normocepalic, pupils are equal and round, conjunctiva is clear, mucous  membranes moist Skin:  Warm, Dry, Pink, No rash.     Respiratory:  Respirations are non-labored, symmetrical chest wall expansion.   Cardiovascular:  Well perfused. No evidence of cyanosis/edema.   Back:  ROM is limited by concordant pain in the lumbar paravertebral area.  Facet loading maneuvers are negative.  There is bilateral PSIS tenderness.  Negative straight leg raise sign bilaterally. Noted lumbar paravertebral muscle spasm.  Positive Patrick's test, equivocal Gaenslen's test.  Extremities: Lower extremity motor strength appears to be grossly intact. Extensor hallucis longus is intact bilaterally. Neuro: Alert and oriented, speech pressured      Lori CHRISTELLA Lori Gailen, NP Jan 15, 2024     Patients pain was assessed, documented as positive, unless stated in the HPI, and followup plan has been documented in the plan. Patients medications list was reviewed and updated if applicable. Body Mass Index (BMI) screening was documented and if the patients BMI was greater than 25 or less than 18.5, the patient was asked to follow up with their PCP for a full assessment regarding weight management. If Fluoroscopy was used during a procedure, the radiation exposure time and number of images were documented within the record.  Urine Drug Screening is a widely available and familiar form of testing used in order to make informed choices with regard to clinical decision making in chronic pain management patients.  Opioid analgesics must be prescribed with discernment and their appropriate use must be assessed periodically.  Urine Drug Screening can help track patient compliance, identify substance abuse and misuse, and prevent potentially dangerous drug-drug interactions.  Today, an 11-panel point of care immunoassay and alcohol screen was performed in order to accomplish this and to help determine the best course of action for today's visit.  Immunoassay urine drug screening, like all tests, is not  without its limitations with regard to specificity and sensitivity.  For example, 1) We are unable to detect fentanyl with our currently available immunoassay technology.  2) We are unable to determine the specific medications present and their metabolites within the drug classes.  3) The Opiate class represents morphine , hydrocodone , and hydromorphone .  Differentiation between these cannot be determined.  4) Likewise, the Methadone class represents methadone and tapentadol.   5) The Oxycodone  class represents oxycodone  and oxymorphone.  The Benzodiazepine class contains all commonly prescribed in that category.  Due to these and other limitations encountered with this form of screening, confirmatory testing may also be deemed to be medically necessary and ordered to assist in making decisions regarding this patient's long-term care.  If an opioid medication was prescribed the patient has also been informed of: -Serious adverse effects of opioids, including potentially fatal respiratory depression and development of a potentially serious lifelong opioid use disorder that can cause distress and inability to fulfill major role obligations. -Common effects of opioids, such as constipation, dry mouth, nausea, vomiting, drowsiness, confusion, tolerance, physical dependence, and withdrawal symptoms when stopping opioids. To prevent constipation associated  with opioid use, patient advised to increase hydration and fiber intake and to maintain or increase physical activity. Stool softeners or laxatives are recommended prn. -Effects that opioids might have on ability to safely operate a vehicle, particularly when opioids are initiated, when dosages are increased, or when other central nervous system depressants, such as benzodiazepines or alcohol, are used concurrently.  -Increased risks for opioid use disorder, respiratory depression, and death at higher dosages, along with the importance of taking only the amount of  opioids prescribed, i.e., not taking more opioids or taking them more often. -Increased risks for respiratory depression when opioids are taken with benzodiazepines, other sedatives, alcohol, illicit drugs or other opioids. -Risks to household members and other individuals if opioids are intentionally or unintentionally shared with others for whom they are not prescribed, including the possibility that others might experience overdose at the same or at lower dosage than prescribed for the patient, and that young children are susceptible to unintentional ingestion. We discussed storage of opioids in a secure, preferably locked location and options for safe disposal of unused opioids. -Planned use of precautions to reduce risks, including use of prescription drug monitoring program information and urine drug testing. We discussed use of naloxone use for overdose reversal. We agreed to review risks and benefits of COT at least every 3 months. -Cognitive limitations might interfere with management of opioid therapy and, if so, determined whether a caregiver can responsibly co-manage medication therapy. Safer alternative medication use was discussed with both the patient and caregiver.                    SABRASABRA

## 2024-05-13 ENCOUNTER — Ambulatory Visit: Attending: Internal Medicine

## 2024-05-13 NOTE — Therapy (Incomplete)
 OUTPATIENT PHYSICAL THERAPY CERVICAL EVALUATION   Patient Name: Lori Houston MRN: [id number] DOB:[birth year], 63 y.o., female Today's Date: 05/13/2024  END OF SESSION:   Past Medical History:  Diagnosis Date   Alcohol dependency (HCC)    Anxiety    Arthritis    Bruxism    COPD (chronic obstructive pulmonary disease) (HCC)    Depression    Drug use    GERD (gastroesophageal reflux disease)    HLD (hyperlipidemia)    HTN (hypertension)    PTSD (post-traumatic stress disorder)    Past Surgical History:  Procedure Laterality Date   TUBAL LIGATION     WRIST RECONSTRUCTION Bilateral    plates   Patient Active Problem List   Diagnosis Date Noted   Low back pain syndrome 06/02/2014    PCP: ***  REFERRING PROVIDER: Vinie Allean BIRCH, MD  REFERRING DIAG: M19.012 (ICD-10-CM) - Primary osteoarthritis, left shoulder  THERAPY DIAG:  No diagnosis found.  Rationale for Evaluation and Treatment: Rehabilitation  ONSET DATE: 04/20/2024- date of referral  SUBJECTIVE:                                                                                                                                                                                                         SUBJECTIVE STATEMENT: *** Hand dominance: {MISC; OT HAND DOMINANCE:[phone]}  PERTINENT HISTORY:  ***  PAIN:  Are you having pain? {OPRCPAIN:27236}  PRECAUTIONS: {Therapy precautions:24002}  RED FLAGS: {PT Red Flags:29287}     WEIGHT BEARING RESTRICTIONS: {Yes ***/No:24003}  FALLS:  Has patient fallen in last 6 months? {fallsyesno:27318}  LIVING ENVIRONMENT: Lives with: {OPRC lives with:25569::lives with their family} Lives in: {Lives in:25570} Stairs: {opstairs:27293} Has following equipment at home: {Assistive devices:23999}  OCCUPATION: ***  PLOF: {PLOF:24004}  PATIENT GOALS: ***  NEXT MD VISIT: ***  OBJECTIVE:  Note: Objective measures were completed at Evaluation unless otherwise  noted.  DIAGNOSTIC FINDINGS:  ***  PATIENT SURVEYS:  {rehab surveys:24030}  COGNITION: Overall cognitive status: {cognition:24006}  SENSATION: {sensation:27233}  POSTURE: {posture:25561}  PALPATION: ***   CERVICAL ROM:   {AROM/PROM:27142} ROM A/PROM (deg) eval  Flexion   Extension   Right lateral flexion   Left lateral flexion   Right rotation   Left rotation    (Blank rows = not tested)  UPPER EXTREMITY ROM:  {AROM/PROM:27142} ROM Right eval Left eval  Shoulder flexion    Shoulder extension    Shoulder abduction    Shoulder adduction    Shoulder extension    Shoulder internal rotation    Shoulder external rotation  Elbow flexion    Elbow extension    Wrist flexion    Wrist extension    Wrist ulnar deviation    Wrist radial deviation    Wrist pronation    Wrist supination     (Blank rows = not tested)  UPPER EXTREMITY MMT:  MMT Right eval Left eval  Shoulder flexion    Shoulder extension    Shoulder abduction    Shoulder adduction    Shoulder extension    Shoulder internal rotation    Shoulder external rotation    Middle trapezius    Lower trapezius    Elbow flexion    Elbow extension    Wrist flexion    Wrist extension    Wrist ulnar deviation    Wrist radial deviation    Wrist pronation    Wrist supination    Grip strength     (Blank rows = not tested)  CERVICAL SPECIAL TESTS:  {Cervical special tests:25246}  FUNCTIONAL TESTS:  {Functional tests:24029}  TREATMENT DATE: ***                                                                                                                                 PATIENT EDUCATION:  Education details: *** Person educated: {Person educated:25204} Education method: {Education Method:25205} Education comprehension: {Education Comprehension:25206}  HOME EXERCISE PROGRAM: ***  ASSESSMENT:  CLINICAL IMPRESSION: Patient is a *** y.o. *** who was seen today for physical therapy  evaluation and treatment for ***.   OBJECTIVE IMPAIRMENTS: {opptimpairments:25111}.   ACTIVITY LIMITATIONS: {activitylimitations:27494}  PARTICIPATION LIMITATIONS: {participationrestrictions:25113}  PERSONAL FACTORS: {Personal factors:25162} are also affecting patient's functional outcome.   REHAB POTENTIAL: {rehabpotential:25112}  CLINICAL DECISION MAKING: {clinical decision making:25114}  EVALUATION COMPLEXITY: {Evaluation complexity:25115}   GOALS: Goals reviewed with patient? {yes/no:20286}  SHORT TERM GOALS: Target date: ***  *** Baseline:  Goal status: INITIAL  2.  *** Baseline:  Goal status: INITIAL  3.  *** Baseline:  Goal status: INITIAL  4.  *** Baseline:  Goal status: INITIAL  5.  *** Baseline:  Goal status: INITIAL  6.  *** Baseline:  Goal status: INITIAL  LONG TERM GOALS: Target date: ***  *** Baseline:  Goal status: INITIAL  2.  *** Baseline:  Goal status: INITIAL  3.  *** Baseline:  Goal status: INITIAL  4.  *** Baseline:  Goal status: INITIAL  5.  *** Baseline:  Goal status: INITIAL  6.  *** Baseline:  Goal status: INITIAL   PLAN:  PT FREQUENCY: {rehab frequency:25116}  PT DURATION: {rehab duration:25117}  PLANNED INTERVENTIONS: {rehab planned interventions:25118::97110-Therapeutic exercises,97530- Therapeutic [phone]- Neuromuscular re-education,97535- Self Rjmz,02859- Manual therapy}  PLAN FOR NEXT SESSION: PIERRETTE Raj LOISE Tobie, PT 05/13/2024, 7:17 AM

## 2024-10-07 ENCOUNTER — Ambulatory Visit: Attending: Internal Medicine | Admitting: Physical Therapy

## 2024-10-13 ENCOUNTER — Ambulatory Visit (HOSPITAL_COMMUNITY)
Admission: EM | Admit: 2024-10-13 | Discharge: 2024-10-13 | Disposition: A | Discharge status: Home / Self Care | Source: Home / Self Care

## 2024-10-13 ENCOUNTER — Ambulatory Visit (HOSPITAL_COMMUNITY)

## 2024-10-13 ENCOUNTER — Encounter (HOSPITAL_COMMUNITY): Payer: Self-pay

## 2024-10-13 DIAGNOSIS — R0789 Other chest pain: Secondary | ICD-10-CM | POA: Diagnosis not present

## 2024-10-13 DIAGNOSIS — S2232XA Fracture of one rib, left side, initial encounter for closed fracture: Secondary | ICD-10-CM

## 2024-10-13 MED ORDER — IBUPROFEN 800 MG PO TABS
ORAL_TABLET | ORAL | Status: AC
  Filled 2024-10-13: qty 1

## 2024-10-13 MED ORDER — IBUPROFEN 800 MG PO TABS
800.0000 mg | ORAL_TABLET | Freq: Once | ORAL | Status: AC
  Administered 2024-10-13: 800 mg via ORAL

## 2024-10-13 NOTE — ED Provider Notes (Signed)
 " [facility]    CSN: [id number] Arrival date & time: 10/13/24  1635      History   Chief Complaint Chief Complaint  Patient presents with   Rib Injury   Fall    HPI Lori Houston is a 64 y.o. female.   HPI She is in today with a friend for left rib pain.  She endorses that she was horse playing and landed on the left between her and her ribs.  She is having pain with breathing.  She is a smoker.  She is requesting an x-ray. Past Medical History:  Diagnosis Date   Alcohol dependency (HCC)    Anxiety    Arthritis    Bruxism    COPD (chronic obstructive pulmonary disease) (HCC)    Depression    Drug use    GERD (gastroesophageal reflux disease)    HLD (hyperlipidemia)    HTN (hypertension)    PTSD (post-traumatic stress disorder)     Patient Active Problem List   Diagnosis Date Noted   Low back pain syndrome 06/02/2014    Past Surgical History:  Procedure Laterality Date   TUBAL LIGATION     WRIST RECONSTRUCTION Bilateral    plates    OB History     Gravida  3   Para      Term      Preterm      AB  1   Living         SAB      IAB      Ectopic      Multiple      Live Births               Home Medications    Prior to Admission medications  Medication Sig Start Date End Date Taking? Authorizing Provider  aspirin EC 81 MG tablet Take 81 mg by mouth daily. 07/04/22   [provider]  atorvastatin (LIPITOR) 20 MG tablet Take 20 mg by mouth daily. 12/12/22   [provider]  loratadine (CLARITIN) 10 MG tablet Take 10 mg by mouth daily as needed. 01/11/23   [provider]  naloxone [facility]) nasal spray 4 mg/0.1 mL Place 1 spray into the nose once. Patient not taking: Reported on 03/20/2023 02/12/23   [provider]  omeprazole (PRILOSEC) 20 MG capsule Take 20 mg by mouth daily.    [provider]    Family History Family History  Problem Relation Age of Onset   Diabetes Mother     Cancer Father        Neck and throat   Multiple sclerosis Brother    Multiple sclerosis Cousin     Social History Social History[1]   Allergies   Patient has no known allergies.   Review of Systems Review of Systems   Physical Exam Triage Vital Signs ED Triage Vitals  Encounter Vitals Group     BP 10/13/24 1653 116/80     Girls Systolic BP Percentile --      Girls Diastolic BP Percentile --      Boys Systolic BP Percentile --      Boys Diastolic BP Percentile --      Pulse Rate 10/13/24 1653 97     Resp 10/13/24 1653 16     Temp 10/13/24 1653 98.2 F (36.8 C)     Temp Source 10/13/24 1653 Oral     SpO2 10/13/24 1653 94 %     Weight --  Height --      Head Circumference --      Peak Flow --      Pain Score 10/13/24 1654 10     Pain Loc --      Pain Education --      Exclude from Growth Chart --    No data found.  Updated Vital Signs BP 116/80 (BP Location: Right Arm)   Pulse 97   Temp 98.2 F (36.8 C) (Oral)   Resp 16   LMP 04/25/2014   SpO2 94%   Visual Acuity Right Eye Distance:   Left Eye Distance:   Bilateral Distance:    Right Eye Near:   Left Eye Near:    Bilateral Near:     Physical Exam Constitutional:      General: She is in acute distress.  HENT:     Head: Normocephalic.  Cardiovascular:     Rate and Rhythm: Normal rate and regular rhythm.     Pulses: Normal pulses.     Heart sounds: Normal heart sounds.  Pulmonary:     Comments: Diminished breath sounds throughout muffle due to patient grunting Skin:    General: Skin is warm and dry.  Neurological:     General: No focal deficit present.     Mental Status: She is alert.  Psychiatric:        Mood and Affect: Mood normal.        Behavior: Behavior normal.     Comments: Room smells like marijuana      UC Treatments / Results  Labs (all labs ordered are listed, but only abnormal results are displayed) Labs Reviewed - No data to display  EKG   Radiology DG Ribs  Unilateral W/Chest Left Result Date: 10/13/2024 EXAM: LEFT RIBS AND CHEST 10/13/2024 05:46:16 PM COMPARISON: None available. CLINICAL HISTORY: Left chest pain after a fall. FINDINGS: BONES: Minimal displaced left 6th anterior rib fracture. LUNGS AND PLEURA: Biapical pleuroparenchymal scarring. Bibasilar atelectasis. Hyperinflation. No consolidation or pulmonary edema. No pleural effusion or pneumothorax. HEART AND MEDIASTINUM: No acute abnormality of the cardiac and mediastinal silhouettes. IMPRESSION: 1. Minimal displaced left 6th anterior rib fracture. 2. Emphysema. Electronically signed by: Norman Gatlin MD 10/13/2024 06:05 PM EST RP Workstation: [id number]    Procedures Procedures (including critical care time)  Medications Ordered in UC Medications  ibuprofen  (ADVIL ) tablet 800 mg (800 mg Oral Given 10/13/24 1757)    Initial Impression / Assessment and Plan / UC Course  I have reviewed the triage vital signs and the nursing notes.  Pertinent labs & imaging results that were available during my care of the patient were reviewed by me and considered in my medical decision making (see chart for details).     RIB PAIN Final Clinical Impressions(s) / UC Diagnoses   Final diagnoses:  Rib pain on left side  Closed fracture of one rib of left side, initial encounter   64 year old female in today with left-sided rib pain.  She endorses that she felt a twinge when she fell.  She is experiencing left middle chest pain which is worse with breathing.  She is not taking any medication for the pain.  She feels like it is progressively getting worse over time.  She also complains of head congestion.   Discharge Instructions      Your x-ray is positive for a Minimal displaced left 6th anterior rib fracture. You have been given Mortin along with an ice pack.  You have been prescribed discharged  ibuprofen  800 mg 3 times daily for the next 7 days as needed for pain.  This will help with inflammation  and pain.  If there is any changes in your breathing you are encouraged to go to the nearest emergency room. We do encourage you to follow up with your primary care provider in 3 to 5 days.     ED Prescriptions   None    PDMP not reviewed this encounter.     [1]  Social History Tobacco Use   Smoking status: Every Day    Current packs/day: 1.00    Types: Cigarettes   Smokeless tobacco: Never  Vaping Use   Vaping status: Never Used  Substance Use Topics   Alcohol use: Yes    Alcohol/week: 25.0 standard drinks of alcohol    Types: 25 Cans of beer per week    Comment: 6 pack per day   Drug use: Yes    Types: Cocaine, Marijuana     Myrna Camelia HERO, NP 10/13/24 1844  "

## 2024-10-13 NOTE — Discharge Instructions (Addendum)
 Your x-ray is positive for a Minimal displaced left 6th anterior rib fracture. You have been given Mortin along with an ice pack.  You have been prescribed discharged ibuprofen  800 mg 3 times daily for the next 7 days as needed for pain.  This will help with inflammation and pain.  If there is any changes in your breathing you are encouraged to go to the nearest emergency room. We do encourage you to follow up with your primary care provider in 3 to 5 days.

## 2024-10-13 NOTE — ED Triage Notes (Addendum)
 Patient states she was wrestling and throwing her body on her significant other and land wrong on him approx 45 minutes ago.. Patient c/o  left rib pain and SOB and states the SOB is worsening.  Patient has not had any medications for her symptoms.  Patient falling asleep in triage at times.
